# Patient Record
Sex: Male | Born: 1975 | Race: Black or African American | Hispanic: No | Marital: Married | Smoking: Current some day smoker
Health system: Southern US, Community
[De-identification: ages and names within clinical notes are randomized; demographics above are authoritative.]

## PROBLEM LIST (undated history)

## (undated) DIAGNOSIS — G4730 Sleep apnea, unspecified: Secondary | ICD-10-CM

## (undated) DIAGNOSIS — F99 Mental disorder, not otherwise specified: Secondary | ICD-10-CM

## (undated) DIAGNOSIS — E7800 Pure hypercholesterolemia, unspecified: Secondary | ICD-10-CM

## (undated) DIAGNOSIS — G43909 Migraine, unspecified, not intractable, without status migrainosus: Secondary | ICD-10-CM

## (undated) DIAGNOSIS — F4310 Post-traumatic stress disorder, unspecified: Secondary | ICD-10-CM

## (undated) DIAGNOSIS — G8929 Other chronic pain: Secondary | ICD-10-CM

## (undated) DIAGNOSIS — M549 Dorsalgia, unspecified: Secondary | ICD-10-CM

## (undated) HISTORY — PX: TONSILECTOMY, ADENOIDECTOMY, BILATERAL MYRINGOTOMY AND TUBES: SHX2538

## (undated) HISTORY — PX: HEMORRHOID SURGERY: SHX153

## (undated) HISTORY — PX: TONSILLECTOMY: SUR1361

## (undated) HISTORY — PX: SPINAL FUSION: SHX223

---

## 1999-08-03 ENCOUNTER — Encounter: Payer: Self-pay | Admitting: Family Medicine

## 1999-08-03 ENCOUNTER — Ambulatory Visit (HOSPITAL_COMMUNITY): Admission: RE | Admit: 1999-08-03 | Discharge: 1999-08-03 | Payer: Self-pay | Admitting: Family Medicine

## 2001-12-22 ENCOUNTER — Emergency Department (HOSPITAL_COMMUNITY): Admission: EM | Admit: 2001-12-22 | Discharge: 2001-12-22 | Payer: Self-pay | Admitting: Emergency Medicine

## 2007-03-29 ENCOUNTER — Emergency Department (HOSPITAL_COMMUNITY): Admission: EM | Admit: 2007-03-29 | Discharge: 2007-03-29 | Payer: Self-pay | Admitting: Family Medicine

## 2007-09-20 ENCOUNTER — Emergency Department (HOSPITAL_COMMUNITY): Admission: EM | Admit: 2007-09-20 | Discharge: 2007-09-20 | Payer: Self-pay | Admitting: Emergency Medicine

## 2008-03-24 ENCOUNTER — Ambulatory Visit: Payer: Self-pay | Admitting: Family Medicine

## 2008-08-31 ENCOUNTER — Ambulatory Visit: Payer: Self-pay | Admitting: Family Medicine

## 2008-09-11 ENCOUNTER — Ambulatory Visit: Payer: Self-pay | Admitting: Family Medicine

## 2008-09-14 ENCOUNTER — Ambulatory Visit: Payer: Self-pay | Admitting: Family Medicine

## 2011-04-15 ENCOUNTER — Other Ambulatory Visit: Payer: Self-pay | Admitting: "Endocrinology

## 2011-04-15 LAB — CORTISOL: Cortisol, Plasma: 7.4 ug/dL

## 2011-04-22 LAB — 17-HYDROXYPROGESTERONE: 17-OH-Progesterone, LC/MS/MS: 86 ng/dL (ref 42–196)

## 2011-05-26 LAB — POCT RAPID STREP A: Streptococcus, Group A Screen (Direct): NEGATIVE

## 2012-03-21 ENCOUNTER — Ambulatory Visit (HOSPITAL_COMMUNITY)
Admission: RE | Admit: 2012-03-21 | Discharge: 2012-03-21 | Disposition: A | Discharge status: Home / Self Care | Payer: 59 | Attending: Psychiatry | Admitting: Psychiatry

## 2012-03-21 ENCOUNTER — Encounter (HOSPITAL_COMMUNITY): Payer: Self-pay | Admitting: *Deleted

## 2012-03-21 DIAGNOSIS — G43909 Migraine, unspecified, not intractable, without status migrainosus: Secondary | ICD-10-CM | POA: Insufficient documentation

## 2012-03-21 DIAGNOSIS — E7800 Pure hypercholesterolemia, unspecified: Secondary | ICD-10-CM | POA: Insufficient documentation

## 2012-03-21 DIAGNOSIS — F489 Nonpsychotic mental disorder, unspecified: Secondary | ICD-10-CM | POA: Insufficient documentation

## 2012-03-21 DIAGNOSIS — G4700 Insomnia, unspecified: Secondary | ICD-10-CM | POA: Insufficient documentation

## 2012-03-21 DIAGNOSIS — F4310 Post-traumatic stress disorder, unspecified: Secondary | ICD-10-CM | POA: Insufficient documentation

## 2012-03-21 DIAGNOSIS — F17200 Nicotine dependence, unspecified, uncomplicated: Secondary | ICD-10-CM | POA: Insufficient documentation

## 2012-03-21 DIAGNOSIS — Z880 Allergy status to penicillin: Secondary | ICD-10-CM | POA: Insufficient documentation

## 2012-03-21 HISTORY — DX: Pure hypercholesterolemia, unspecified: E78.00

## 2012-03-21 HISTORY — DX: Mental disorder, not otherwise specified: F99
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
## 2012-11-01 ENCOUNTER — Emergency Department (HOSPITAL_COMMUNITY): Payer: 59

## 2012-11-01 ENCOUNTER — Encounter (HOSPITAL_COMMUNITY): Payer: Self-pay | Admitting: Neurology

## 2012-11-01 ENCOUNTER — Emergency Department (HOSPITAL_COMMUNITY)
Admission: EM | Admit: 2012-11-01 | Discharge: 2012-11-01 | Disposition: A | Discharge status: Home / Self Care | Payer: 59 | Attending: Emergency Medicine | Admitting: Emergency Medicine

## 2012-11-01 DIAGNOSIS — G8929 Other chronic pain: Secondary | ICD-10-CM | POA: Insufficient documentation

## 2012-11-01 DIAGNOSIS — G43909 Migraine, unspecified, not intractable, without status migrainosus: Secondary | ICD-10-CM | POA: Insufficient documentation

## 2012-11-01 DIAGNOSIS — E7800 Pure hypercholesterolemia, unspecified: Secondary | ICD-10-CM | POA: Insufficient documentation

## 2012-11-01 DIAGNOSIS — R5381 Other malaise: Secondary | ICD-10-CM | POA: Insufficient documentation

## 2012-11-01 DIAGNOSIS — G4730 Sleep apnea, unspecified: Secondary | ICD-10-CM | POA: Insufficient documentation

## 2012-11-01 DIAGNOSIS — M5416 Radiculopathy, lumbar region: Secondary | ICD-10-CM

## 2012-11-01 DIAGNOSIS — Z8659 Personal history of other mental and behavioral disorders: Secondary | ICD-10-CM | POA: Insufficient documentation

## 2012-11-01 DIAGNOSIS — R5383 Other fatigue: Secondary | ICD-10-CM | POA: Insufficient documentation

## 2012-11-01 DIAGNOSIS — IMO0002 Reserved for concepts with insufficient information to code with codable children: Secondary | ICD-10-CM | POA: Insufficient documentation

## 2012-11-01 DIAGNOSIS — Z79899 Other long term (current) drug therapy: Secondary | ICD-10-CM | POA: Insufficient documentation

## 2012-11-01 DIAGNOSIS — F17200 Nicotine dependence, unspecified, uncomplicated: Secondary | ICD-10-CM | POA: Insufficient documentation

## 2012-11-01 HISTORY — DX: Migraine, unspecified, not intractable, without status migrainosus: G43.909

## 2012-11-01 HISTORY — DX: Other chronic pain: G89.29

## 2012-11-01 HISTORY — DX: Dorsalgia, unspecified: M54.9

## 2012-11-01 HISTORY — DX: Sleep apnea, unspecified: G47.30

## 2012-11-01 MED ORDER — METOCLOPRAMIDE HCL 5 MG/ML IJ SOLN
10.0000 mg | Freq: Once | INTRAMUSCULAR | Status: AC
  Administered 2012-11-01: 10 mg via INTRAVENOUS
  Filled 2012-11-01: qty 2

## 2012-11-01 MED ORDER — KETOROLAC TROMETHAMINE 30 MG/ML IJ SOLN
15.0000 mg | Freq: Once | INTRAMUSCULAR | Status: AC
  Administered 2012-11-01: 15 mg via INTRAVENOUS
  Filled 2012-11-01: qty 1

## 2012-11-01 MED ORDER — METHOCARBAMOL 500 MG PO TABS: 1000.0000 mg | ORAL_TABLET | Freq: Four times a day (QID) | ORAL | Status: DC

## 2012-11-01 MED ORDER — PREDNISONE 20 MG PO TABS: ORAL_TABLET | ORAL | Status: DC

## 2012-11-01 MED ORDER — OXYCODONE-ACETAMINOPHEN 5-325 MG PO TABS: 1.0000 | ORAL_TABLET | Freq: Four times a day (QID) | ORAL | Status: DC | PRN

## 2012-11-01 MED ORDER — IBUPROFEN 600 MG PO TABS: 600.0000 mg | ORAL_TABLET | Freq: Four times a day (QID) | ORAL | Status: DC | PRN

## 2012-11-01 MED ORDER — DIPHENHYDRAMINE HCL 50 MG/ML IJ SOLN
25.0000 mg | Freq: Once | INTRAMUSCULAR | Status: AC
  Administered 2012-11-01: 25 mg via INTRAVENOUS
  Filled 2012-11-01: qty 1
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
## 2012-11-01 NOTE — ED Notes (Signed)
Pt returned from MRI °

## 2012-11-04 NOTE — ED Provider Notes (Signed)
Medical screening examination/treatment/procedure(s) were performed by non-physician practitioner and as supervising physician I was immediately available for consultation/collaboration.   Gwyneth Sprout, MD 11/04/12 2302

## 2012-11-07 ENCOUNTER — Encounter (HOSPITAL_COMMUNITY): Payer: Self-pay | Admitting: Pharmacy Technician

## 2012-11-08 ENCOUNTER — Encounter (HOSPITAL_COMMUNITY): Payer: Self-pay | Admitting: *Deleted

## 2012-11-11 ENCOUNTER — Encounter (HOSPITAL_COMMUNITY): Payer: Self-pay | Admitting: Anesthesiology

## 2012-11-11 ENCOUNTER — Observation Stay (HOSPITAL_COMMUNITY)
Admission: RE | Admit: 2012-11-11 | Discharge: 2012-11-11 | Disposition: A | Discharge status: Home / Self Care | Payer: 59 | Source: Ambulatory Visit | Attending: Neurosurgery | Admitting: Neurosurgery

## 2012-11-11 ENCOUNTER — Observation Stay (HOSPITAL_COMMUNITY): Payer: 59

## 2012-11-11 ENCOUNTER — Ambulatory Visit (HOSPITAL_COMMUNITY): Payer: 59 | Admitting: Anesthesiology

## 2012-11-11 ENCOUNTER — Encounter (HOSPITAL_COMMUNITY)
Admission: RE | Disposition: A | Discharge status: Home / Self Care | Payer: Self-pay | Source: Ambulatory Visit | Attending: Neurosurgery

## 2012-11-11 DIAGNOSIS — M5126 Other intervertebral disc displacement, lumbar region: Principal | ICD-10-CM | POA: Insufficient documentation

## 2012-11-11 DIAGNOSIS — Z23 Encounter for immunization: Secondary | ICD-10-CM | POA: Insufficient documentation

## 2012-11-11 HISTORY — PX: LUMBAR LAMINECTOMY/DECOMPRESSION MICRODISCECTOMY: SHX5026

## 2012-11-11 HISTORY — DX: Post-traumatic stress disorder, unspecified: F43.10

## 2012-11-11 LAB — SURGICAL PCR SCREEN
MRSA, PCR: NEGATIVE
Staphylococcus aureus: NEGATIVE

## 2012-11-11 LAB — CREATININE, SERUM
Creatinine, Ser: 1.09 mg/dL (ref 0.50–1.35)
GFR calc Af Amer: >90 mL/min (ref >90)
GFR calc non Af Amer: 86 mL/min — ABNORMAL LOW (ref >90)

## 2012-11-11 LAB — CBC
HCT: 44.8 % (ref 39.0–52.0)
Hemoglobin: 16.1 g/dL (ref 13.0–17.0)
MCH: 31.9 pg (ref 26.0–34.0)
MCHC: 35.9 g/dL (ref 30.0–36.0)
MCV: 88.7 fL (ref 78.0–100.0)
Platelets: 194 10*3/uL (ref 150–400)
RBC: 5.05 MIL/uL (ref 4.22–5.81)
RDW: 12.5 % (ref 11.5–15.5)
WBC: 5.5 10*3/uL (ref 4.0–10.5)

## 2012-11-11 SURGERY — LUMBAR LAMINECTOMY/DECOMPRESSION MICRODISCECTOMY 1 LEVEL
Anesthesia: General | Site: Back | Laterality: Left | Wound class: Clean

## 2012-11-11 MED ORDER — MUPIROCIN 2 % EX OINT: TOPICAL_OINTMENT | Freq: Two times a day (BID) | CUTANEOUS | Status: DC

## 2012-11-11 MED ORDER — SODIUM CHLORIDE 0.9 % IV SOLN: 250.0000 mL | INTRAVENOUS | Status: DC

## 2012-11-11 MED ORDER — GLYCOPYRROLATE 0.2 MG/ML IJ SOLN
INTRAMUSCULAR | Status: DC | PRN
  Administered 2012-11-11: 0.6 mg via INTRAVENOUS

## 2012-11-11 MED ORDER — OXYCODONE HCL 5 MG PO TABS: 5.0000 mg | ORAL_TABLET | Freq: Once | ORAL | Status: DC | PRN

## 2012-11-11 MED ORDER — FENTANYL CITRATE 0.05 MG/ML IJ SOLN
INTRAMUSCULAR | Status: DC | PRN
  Administered 2012-11-11 (×2): 100 ug via INTRAVENOUS
  Administered 2012-11-11: 50 ug via INTRAVENOUS

## 2012-11-11 MED ORDER — NEOSTIGMINE METHYLSULFATE 1 MG/ML IJ SOLN
INTRAMUSCULAR | Status: DC | PRN
  Administered 2012-11-11: 4 mg via INTRAVENOUS

## 2012-11-11 MED ORDER — ARTIFICIAL TEARS OP OINT
TOPICAL_OINTMENT | OPHTHALMIC | Status: DC | PRN
  Administered 2012-11-11: 1 via OPHTHALMIC

## 2012-11-11 MED ORDER — VANCOMYCIN HCL IN DEXTROSE 1-5 GM/200ML-% IV SOLN: 1000.0000 mg | INTRAVENOUS | Status: DC

## 2012-11-11 MED ORDER — CYCLOBENZAPRINE HCL 10 MG PO TABS
10.0000 mg | ORAL_TABLET | Freq: Three times a day (TID) | ORAL | Status: DC | PRN
  Administered 2012-11-11: 10 mg via ORAL
  Filled 2012-11-11: qty 1

## 2012-11-11 MED ORDER — LACTATED RINGERS IV SOLN
INTRAVENOUS | Status: DC | PRN
  Administered 2012-11-11 (×2): via INTRAVENOUS

## 2012-11-11 MED ORDER — VANCOMYCIN HCL IN DEXTROSE 1-5 GM/200ML-% IV SOLN
1000.0000 mg | Freq: Once | INTRAVENOUS | Status: DC
  Filled 2012-11-11: qty 200

## 2012-11-11 MED ORDER — VECURONIUM BROMIDE 10 MG IV SOLR
INTRAVENOUS | Status: DC | PRN
  Administered 2012-11-11: 2 mg via INTRAVENOUS

## 2012-11-11 MED ORDER — SIMVASTATIN 20 MG PO TABS
20.0000 mg | ORAL_TABLET | Freq: Every evening | ORAL | Status: DC
  Filled 2012-11-11: qty 1

## 2012-11-11 MED ORDER — PROPOFOL 10 MG/ML IV BOLUS
INTRAVENOUS | Status: DC | PRN
  Administered 2012-11-11: 200 mg via INTRAVENOUS

## 2012-11-11 MED ORDER — OXYCODONE HCL 5 MG/5ML PO SOLN: 5.0000 mg | Freq: Once | ORAL | Status: DC | PRN

## 2012-11-11 MED ORDER — OXYCODONE-ACETAMINOPHEN 5-325 MG PO TABS: 1.0000 | ORAL_TABLET | ORAL | Status: DC | PRN

## 2012-11-11 MED ORDER — BACITRACIN 50000 UNITS IM SOLR
INTRAMUSCULAR | Status: AC
  Filled 2012-11-11: qty 1

## 2012-11-11 MED ORDER — DEXAMETHASONE SODIUM PHOSPHATE 10 MG/ML IJ SOLN
INTRAMUSCULAR | Status: AC
  Administered 2012-11-11: 10 mg via INTRAVENOUS
  Filled 2012-11-11: qty 1

## 2012-11-11 MED ORDER — SENNA 8.6 MG PO TABS
1.0000 | ORAL_TABLET | Freq: Two times a day (BID) | ORAL | Status: DC
  Administered 2012-11-11: 8.6 mg via ORAL
  Filled 2012-11-11: qty 1

## 2012-11-11 MED ORDER — KETOROLAC TROMETHAMINE 30 MG/ML IJ SOLN
30.0000 mg | Freq: Four times a day (QID) | INTRAMUSCULAR | Status: DC
  Administered 2012-11-11: 30 mg via INTRAVENOUS
  Filled 2012-11-11: qty 1

## 2012-11-11 MED ORDER — TOPIRAMATE 100 MG PO TABS
200.0000 mg | ORAL_TABLET | Freq: Two times a day (BID) | ORAL | Status: DC
  Administered 2012-11-11: 200 mg via ORAL
  Filled 2012-11-11 (×2): qty 2

## 2012-11-11 MED ORDER — ONDANSETRON HCL 4 MG/2ML IJ SOLN: 4.0000 mg | INTRAMUSCULAR | Status: DC | PRN

## 2012-11-11 MED ORDER — ACETAMINOPHEN 650 MG RE SUPP: 650.0000 mg | RECTAL | Status: DC | PRN

## 2012-11-11 MED ORDER — HYDROMORPHONE HCL PF 1 MG/ML IJ SOLN
INTRAMUSCULAR | Status: AC
  Filled 2012-11-11: qty 1

## 2012-11-11 MED ORDER — MENTHOL 3 MG MT LOZG: 1.0000 | LOZENGE | OROMUCOSAL | Status: DC | PRN

## 2012-11-11 MED ORDER — LIDOCAINE HCL 4 % MT SOLN
OROMUCOSAL | Status: DC | PRN
  Administered 2012-11-11: 4 mL via TOPICAL

## 2012-11-11 MED ORDER — SODIUM CHLORIDE 0.9 % IV SOLN
INTRAVENOUS | Status: AC
  Filled 2012-11-11: qty 500

## 2012-11-11 MED ORDER — 0.9 % SODIUM CHLORIDE (POUR BTL) OPTIME
TOPICAL | Status: DC | PRN
  Administered 2012-11-11: 1000 mL

## 2012-11-11 MED ORDER — VANCOMYCIN HCL IN DEXTROSE 1-5 GM/200ML-% IV SOLN
INTRAVENOUS | Status: AC
  Administered 2012-11-11: 1000 mg via INTRAVENOUS
  Filled 2012-11-11: qty 200

## 2012-11-11 MED ORDER — HYDROCODONE-ACETAMINOPHEN 5-325 MG PO TABS
1.0000 | ORAL_TABLET | ORAL | Status: DC | PRN
  Administered 2012-11-11: 2 via ORAL
  Filled 2012-11-11: qty 2

## 2012-11-11 MED ORDER — CYCLOBENZAPRINE HCL 10 MG PO TABS: 10.0000 mg | ORAL_TABLET | Freq: Three times a day (TID) | ORAL | Status: DC | PRN

## 2012-11-11 MED ORDER — ROCURONIUM BROMIDE 100 MG/10ML IV SOLN
INTRAVENOUS | Status: DC | PRN
  Administered 2012-11-11: 50 mg via INTRAVENOUS

## 2012-11-11 MED ORDER — METOCLOPRAMIDE HCL 5 MG/ML IJ SOLN: 10.0000 mg | Freq: Once | INTRAMUSCULAR | Status: DC | PRN

## 2012-11-11 MED ORDER — HYDROMORPHONE HCL PF 1 MG/ML IJ SOLN
0.2500 mg | INTRAMUSCULAR | Status: DC | PRN
  Administered 2012-11-11 (×2): 0.5 mg via INTRAVENOUS

## 2012-11-11 MED ORDER — MUPIROCIN 2 % EX OINT
TOPICAL_OINTMENT | CUTANEOUS | Status: AC
  Administered 2012-11-11: 07:00:00 via NASAL
  Filled 2012-11-11: qty 22

## 2012-11-11 MED ORDER — KETOROLAC TROMETHAMINE 30 MG/ML IJ SOLN
INTRAMUSCULAR | Status: DC | PRN
  Administered 2012-11-11: 30 mg via INTRAVENOUS

## 2012-11-11 MED ORDER — PHENYLEPHRINE HCL 10 MG/ML IJ SOLN
INTRAMUSCULAR | Status: DC | PRN
  Administered 2012-11-11: 80 ug via INTRAVENOUS

## 2012-11-11 MED ORDER — LIDOCAINE HCL (CARDIAC) 20 MG/ML IV SOLN
INTRAVENOUS | Status: DC | PRN
  Administered 2012-11-11: 100 mg via INTRAVENOUS

## 2012-11-11 MED ORDER — HYDROCODONE-ACETAMINOPHEN 5-325 MG PO TABS: 1.0000 | ORAL_TABLET | ORAL | Status: DC | PRN

## 2012-11-11 MED ORDER — ACETAMINOPHEN 325 MG PO TABS: 650.0000 mg | ORAL_TABLET | ORAL | Status: DC | PRN

## 2012-11-11 MED ORDER — PHENOL 1.4 % MT LIQD: 1.0000 | OROMUCOSAL | Status: DC | PRN

## 2012-11-11 MED ORDER — INFLUENZA VIRUS VACC SPLIT PF IM SUSP
0.5000 mL | Freq: Once | INTRAMUSCULAR | Status: AC
  Administered 2012-11-11: 0.5 mL via INTRAMUSCULAR
  Filled 2012-11-11: qty 0.5

## 2012-11-11 MED ORDER — BUPIVACAINE HCL (PF) 0.25 % IJ SOLN
INTRAMUSCULAR | Status: DC | PRN
  Administered 2012-11-11: 20 mL

## 2012-11-11 MED ORDER — DEXTROSE 5 % IV SOLN
INTRAVENOUS | Status: DC | PRN
  Administered 2012-11-11: 08:00:00 via INTRAVENOUS

## 2012-11-11 MED ORDER — ONDANSETRON HCL 4 MG/2ML IJ SOLN
INTRAMUSCULAR | Status: DC | PRN
  Administered 2012-11-11: 4 mg via INTRAVENOUS

## 2012-11-11 MED ORDER — ALUM & MAG HYDROXIDE-SIMETH 200-200-20 MG/5ML PO SUSP: 30.0000 mL | Freq: Four times a day (QID) | ORAL | Status: DC | PRN

## 2012-11-11 MED ORDER — ZOLPIDEM TARTRATE 5 MG PO TABS: 5.0000 mg | ORAL_TABLET | Freq: Every evening | ORAL | Status: DC | PRN

## 2012-11-11 MED ORDER — SODIUM CHLORIDE 0.9 % IJ SOLN: 3.0000 mL | INTRAMUSCULAR | Status: DC | PRN

## 2012-11-11 MED ORDER — HEMOSTATIC AGENTS (NO CHARGE) OPTIME
TOPICAL | Status: DC | PRN
  Administered 2012-11-11: 1 via TOPICAL

## 2012-11-11 MED ORDER — MIDAZOLAM HCL 5 MG/5ML IJ SOLN
INTRAMUSCULAR | Status: DC | PRN
  Administered 2012-11-11: 2 mg via INTRAVENOUS

## 2012-11-11 MED ORDER — SODIUM CHLORIDE 0.9 % IJ SOLN: 3.0000 mL | Freq: Two times a day (BID) | INTRAMUSCULAR | Status: DC

## 2012-11-11 MED ORDER — HYDROMORPHONE HCL PF 1 MG/ML IJ SOLN
0.5000 mg | INTRAMUSCULAR | Status: DC | PRN
  Administered 2012-11-11: 1 mg via INTRAVENOUS
  Filled 2012-11-11: qty 1

## 2012-11-11 MED ORDER — SODIUM CHLORIDE 0.9 % IR SOLN
Status: DC | PRN
  Administered 2012-11-11: 09:00:00

## 2012-11-11 SURGICAL SUPPLY — 47 items
ADH SKN CLS APL DERMABOND .7 (GAUZE/BANDAGES/DRESSINGS) ×1
APL SKNCLS STERI-STRIP NONHPOA (GAUZE/BANDAGES/DRESSINGS) ×1
BAG DECANTER FOR FLEXI CONT (MISCELLANEOUS) ×2 IMPLANT
BENZOIN TINCTURE PRP APPL 2/3 (GAUZE/BANDAGES/DRESSINGS) ×2 IMPLANT
BLADE SURG ROTATE 9660 (MISCELLANEOUS) IMPLANT
BRUSH SCRUB EZ PLAIN DRY (MISCELLANEOUS) ×2 IMPLANT
BUR CUTTER 7.0 ROUND (BURR) ×2 IMPLANT
CANISTER SUCTION 2500CC (MISCELLANEOUS) ×2 IMPLANT
CLOTH BEACON ORANGE TIMEOUT ST (SAFETY) ×2 IMPLANT
CONT SPEC 4OZ CLIKSEAL STRL BL (MISCELLANEOUS) ×2 IMPLANT
DECANTER SPIKE VIAL GLASS SM (MISCELLANEOUS) ×2 IMPLANT
DERMABOND ADVANCED (GAUZE/BANDAGES/DRESSINGS) ×1
DERMABOND ADVANCED .7 DNX12 (GAUZE/BANDAGES/DRESSINGS) ×1 IMPLANT
DRAPE LAPAROTOMY 100X72X124 (DRAPES) ×2 IMPLANT
DRAPE MICROSCOPE ZEISS OPMI (DRAPES) ×2 IMPLANT
DRAPE POUCH INSTRU U-SHP 10X18 (DRAPES) ×2 IMPLANT
DRAPE PROXIMA HALF (DRAPES) IMPLANT
DRAPE SURG 17X23 STRL (DRAPES) ×4 IMPLANT
ELECT REM PT RETURN 9FT ADLT (ELECTROSURGICAL) ×2
ELECTRODE REM PT RTRN 9FT ADLT (ELECTROSURGICAL) ×1 IMPLANT
GAUZE SPONGE 4X4 16PLY XRAY LF (GAUZE/BANDAGES/DRESSINGS) IMPLANT
GLOVE ECLIPSE 8.5 STRL (GLOVE) ×2 IMPLANT
GLOVE EXAM NITRILE LRG STRL (GLOVE) IMPLANT
GLOVE EXAM NITRILE MD LF STRL (GLOVE) IMPLANT
GLOVE EXAM NITRILE XL STR (GLOVE) IMPLANT
GLOVE EXAM NITRILE XS STR PU (GLOVE) IMPLANT
GOWN BRE IMP SLV AUR LG STRL (GOWN DISPOSABLE) IMPLANT
GOWN BRE IMP SLV AUR XL STRL (GOWN DISPOSABLE) ×2 IMPLANT
GOWN STRL REIN 2XL LVL4 (GOWN DISPOSABLE) IMPLANT
KIT BASIN OR (CUSTOM PROCEDURE TRAY) ×2 IMPLANT
KIT ROOM TURNOVER OR (KITS) ×2 IMPLANT
NDL SPNL 22GX3.5 QUINCKE BK (NEEDLE) ×1 IMPLANT
NEEDLE HYPO 22GX1.5 SAFETY (NEEDLE) ×2 IMPLANT
NEEDLE SPNL 22GX3.5 QUINCKE BK (NEEDLE) ×2 IMPLANT
NS IRRIG 1000ML POUR BTL (IV SOLUTION) ×2 IMPLANT
PACK LAMINECTOMY NEURO (CUSTOM PROCEDURE TRAY) ×2 IMPLANT
PAD ARMBOARD 7.5X6 YLW CONV (MISCELLANEOUS) ×6 IMPLANT
RUBBERBAND STERILE (MISCELLANEOUS) ×4 IMPLANT
SPONGE GAUZE 4X4 12PLY (GAUZE/BANDAGES/DRESSINGS) ×2 IMPLANT
SPONGE SURGIFOAM ABS GEL SZ50 (HEMOSTASIS) ×2 IMPLANT
STRIP CLOSURE SKIN 1/2X4 (GAUZE/BANDAGES/DRESSINGS) ×2 IMPLANT
SUT VIC AB 2-0 CT1 18 (SUTURE) ×2 IMPLANT
SUT VIC AB 3-0 SH 8-18 (SUTURE) ×2 IMPLANT
SYR 20ML ECCENTRIC (SYRINGE) ×2 IMPLANT
TOWEL OR 17X24 6PK STRL BLUE (TOWEL DISPOSABLE) ×2 IMPLANT
TOWEL OR 17X26 10 PK STRL BLUE (TOWEL DISPOSABLE) ×2 IMPLANT
WATER STERILE IRR 1000ML POUR (IV SOLUTION) ×2 IMPLANT

## 2012-11-11 NOTE — H&P (Signed)
Johnny Clarke is an 37 y.o. male.   Chief Complaint: Back and left leg pain HPI: 37 year old male with a long history of back pain with recent worsening overall symptoms and progressive left lower extremity radicular pain failing conservative management. Workup demonstrates evidence of a central L5-S1 disc herniation by his towards the left which is compressing his left S1 nerve root. Remainder of his lumbar spine looks healthy. Patient presents now for left-sided L5-S1 microdiscectomy in hopes of improving his symptoms.  Past Medical History  Diagnosis Date  . Mental disorder   . High cholesterol   . Chronic back pain   . Sleep apnea   . PTSD (post-traumatic stress disorder)   . Migraine     Takes topamax    Past Surgical History  Procedure Laterality Date  . Tonsillectomy    . Hemorrhoid surgery    . Tonsilectomy, adenoidectomy, bilateral myringotomy and tubes      tubes 16 times    History reviewed. No pertinent family history. Social History:  reports that he has been smoking Cigarettes.  He has a .6 pack-year smoking history. He does not have any smokeless tobacco history on file. He reports that he drinks about 3.6 ounces of alcohol per week. He reports that he does not use illicit drugs.  Allergies:  Allergies  Allergen Reactions  . Penicillins     Pt reports that he is allergic to all penicillins.    Medications Prior to Admission  Medication Sig Dispense Refill  . butalbital-aspirin-caffeine (FIORINAL) 50-325-40 MG per capsule Take 1 capsule by mouth every 4 (four) hours as needed for headache.      . simvastatin (ZOCOR) 40 MG tablet Take 20 mg by mouth every evening.      . topiramate (TOPAMAX) 200 MG tablet Take 200 mg by mouth 2 (two) times daily.      Marland Kitchen zolpidem (AMBIEN) 10 MG tablet Take 20 mg by mouth at bedtime.         Results for orders placed during the hospital encounter of 11/11/12 (from the past 48 hour(s))  CBC     Status: None   Collection Time   11/11/12  6:46 AM      Result Value Range   WBC 5.5  4.0 - 10.5 K/uL   RBC 5.05  4.22 - 5.81 MIL/uL   Hemoglobin 16.1  13.0 - 17.0 g/dL   HCT 16.1  09.6 - 04.5 %   MCV 88.7  78.0 - 100.0 fL   MCH 31.9  26.0 - 34.0 pg   MCHC 35.9  30.0 - 36.0 g/dL   RDW 40.9  81.1 - 91.4 %   Platelets 194  150 - 400 K/uL   No results found.  Review of Systems  Constitutional: Negative.   HENT: Negative.   Eyes: Negative.   Respiratory: Negative.   Cardiovascular: Negative.   Gastrointestinal: Negative.   Genitourinary: Negative.   Musculoskeletal: Negative.   Skin: Negative.   Neurological: Negative.   Endo/Heme/Allergies: Negative.   Psychiatric/Behavioral: Negative.     Blood pressure 124/81, pulse 82, temperature 98.6 F (37 C), temperature source Oral, height 5' 9.5" (1.765 m), weight 84.5 kg (186 lb 4.6 oz). Physical Exam  Constitutional: He is oriented to person, place, and time. He appears well-developed and well-nourished.  HENT:  Head: Normocephalic and atraumatic.  Right Ear: External ear normal.  Left Ear: External ear normal.  Nose: Nose normal.  Mouth/Throat: Oropharynx is clear and moist.  Eyes: Conjunctivae and  EOM are normal. Pupils are equal, round, and reactive to light. Right eye exhibits no discharge. Left eye exhibits no discharge.  Neck: Normal range of motion. Neck supple. No tracheal deviation present. No thyromegaly present.  Cardiovascular: Normal rate, regular rhythm, normal heart sounds and intact distal pulses.   No murmur heard. Respiratory: Effort normal and breath sounds normal. No respiratory distress. He has no wheezes.  GI: Soft. Bowel sounds are normal. He exhibits no distension. There is no tenderness.  Musculoskeletal: Normal range of motion. He exhibits no edema and no tenderness.  Neurological: He is alert and oriented to person, place, and time. He has normal reflexes. No cranial nerve deficit. Coordination normal.  Skin: Skin is warm and dry. No  rash noted. No erythema.  Psychiatric: He has a normal mood and affect. His behavior is normal. Judgment and thought content normal.     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 Plan left L5-S1 laminotomy and microdiscectomy. Risks and benefits have been explained. Patient wishes to proceed.  Chanze Teagle A 11/11/2012, 7:40 AM

## 2012-11-11 NOTE — Progress Notes (Signed)
ANTIBIOTIC CONSULT NOTE - INITIAL  Pharmacy Consult for vancomycin Indication: surgical prophylaxis s/p neuro surgery  Allergies  Allergen Reactions  . Penicillins     Pt reports that he is allergic to all penicillins.    Patient Measurements: Height: 5' 9.5" (176.5 cm) Weight: 186 lb 4.6 oz (84.5 kg) IBW/kg (Calculated) : 71.85 Adjusted Body Weight: 84.5 kg   Vital Signs: Temp: 98.9 F (37.2 C) (03/10 1045) Temp src: Oral (03/10 0614) BP: 132/80 mmHg (03/10 1045) Pulse Rate: 71 (03/10 1045) Intake/Output from previous day:   Intake/Output from this shift: Total I/O In: 1250 [I.V.:1250] Out: 10 [Blood:10]  Labs:  Recent Labs  11/11/12 0646  WBC 5.5  HGB 16.1  PLT 194   CrCl is unknown because no creatinine reading has been taken. No results found for this basename: VANCOTROUGH, Leodis Binet, VANCORANDOM, GENTTROUGH, GENTPEAK, GENTRANDOM, TOBRATROUGH, TOBRAPEAK, TOBRARND, AMIKACINPEAK, AMIKACINTROU, AMIKACIN,  in the last 72 hours   Microbiology: Recent Results (from the past 720 hour(s))  SURGICAL PCR SCREEN     Status: None   Collection Time    11/11/12  6:45 AM      Result Value Range Status   MRSA, PCR NEGATIVE  NEGATIVE Final   Staphylococcus aureus NEGATIVE  NEGATIVE Final   Comment:            The Xpert SA Assay (FDA     approved for NASAL specimens     in patients over 46 years of age),     is one component of     a comprehensive surveillance     program.  Test performance has     been validated by [employer] for patients greater     than or equal to 22 year old.     It is not intended     to diagnose infection nor to     guide or monitor treatment.    Medical History: Past Medical History  Diagnosis Date  . Mental disorder   . High cholesterol   . Chronic back pain   . Sleep apnea   . PTSD (post-traumatic stress disorder)   . Migraine     Takes topamax    Medications:  Scheduled:  . bacitracin      . [COMPLETED] dexamethasone       . HYDROmorphone      . influenza  inactive virus vaccine  0.5 mL Intramuscular Once  . ketorolac  30 mg Intravenous Q6H  . mupirocin ointment   Nasal BID  . [COMPLETED] mupirocin ointment      . senna  1 tablet Oral BID  . simvastatin  20 mg Oral QPM  . sodium chloride      . sodium chloride  3 mL Intravenous Q12H  . topiramate  200 mg Oral BID  . [COMPLETED] vancomycin      . [DISCONTINUED] vancomycin  1,000 mg Intravenous 60 min Pre-Op   Infusions:  . sodium chloride     Assessment: 37 yo male s/p neuro surgery will be put on vancomycin x x1 dose for surgical prophylaxis.  Patient doesn't have history of renal dysfunction.  Patient doesn't have a drain per RN and patient received one dose of vancomycin 1g iv x1 at 0756 today.  Goal of Therapy:  Vancomycin trough level 15-20 mcg/ml  Plan:  1) Vancomycin 1g iv x1 at 1930 tonight x1 dose and sign off  Aniela Caniglia, Tsz-Yin 11/11/2012,11:21 AM

## 2012-11-11 NOTE — Brief Op Note (Signed)
11/11/2012  9:01 AM  PATIENT:  Johnny Clarke  37 y.o. male  PRE-OPERATIVE DIAGNOSIS:  HNP  POST-OPERATIVE DIAGNOSIS:  herniated nucleus pulposus  PROCEDURE:  Procedure(s) with comments: LUMBAR LAMINECTOMY/DECOMPRESSION MICRODISCECTOMY 1 LEVEL (Left) - left five sacral one  SURGEON:  Surgeon(s) and Role:    * Temple Pacini, MD - Primary  PHYSICIAN ASSISTANT:   ASSISTANTS: none   ANESTHESIA:   general  EBL:  Total I/O In: 1000 [I.V.:1000] Out: -   BLOOD ADMINISTERED:none  DRAINS: none   LOCAL MEDICATIONS USED:  MARCAINE     SPECIMEN:  No Specimen  DISPOSITION OF SPECIMEN:  N/A  COUNTS:  YES  TOURNIQUET:  * No tourniquets in log *  DICTATION: .Dragon Dictation  PLAN OF CARE: Admit for overnight observation  PATIENT DISPOSITION:  PACU - hemodynamically stable.   Delay start of Pharmacological VTE agent (>24hrs) due to surgical blood loss or risk of bleeding: yes

## 2012-11-11 NOTE — Transfer of Care (Signed)
Immediate Anesthesia Transfer of Care Note  Patient: Johnny Clarke  Procedure(s) Performed: Procedure(s) with comments: LUMBAR LAMINECTOMY/DECOMPRESSION MICRODISCECTOMY 1 LEVEL (Left) - left five sacral one  Patient Location: PACU  Anesthesia Type:General  Level of Consciousness: awake, alert  and oriented  Airway & Oxygen Therapy: Patient Spontanous Breathing and Patient connected to face mask oxygen  Post-op Assessment: Report given to PACU RN, Post -op Vital signs reviewed and stable and Patient moving all extremities X 4  Post vital signs: Reviewed and stable  Complications: No apparent anesthesia complications

## 2012-11-11 NOTE — Preoperative (Signed)
Beta Blockers   Reason not to administer Beta Blockers:Not Applicable 

## 2012-11-11 NOTE — Progress Notes (Signed)
UR COMPLETED  

## 2012-11-11 NOTE — Anesthesia Preprocedure Evaluation (Signed)
Anesthesia Evaluation  Patient identified by MRN, date of birth, ID band Patient awake    Reviewed: Allergy & Precautions, H&P , NPO status , Patient's Chart, lab work & pertinent test results, reviewed documented beta blocker date and time   Airway Mallampati: II TM Distance: >3 FB Neck ROM: full    Dental   Pulmonary sleep apnea , Current Smoker,  breath sounds clear to auscultation        Cardiovascular negative cardio ROS  Rhythm:regular     Neuro/Psych  Headaches, PSYCHIATRIC DISORDERS    GI/Hepatic negative GI ROS, Neg liver ROS,   Endo/Other  negative endocrine ROS  Renal/GU negative Renal ROS  negative genitourinary   Musculoskeletal   Abdominal   Peds  Hematology negative hematology ROS (+)   Anesthesia Other Findings See surgeon's H&P   Reproductive/Obstetrics negative OB ROS                           Anesthesia Physical Anesthesia Plan  ASA: II  Anesthesia Plan: General   Post-op Pain Management:    Induction: Intravenous  Airway Management Planned: Oral ETT  Additional Equipment:   Intra-op Plan:   Post-operative Plan: Extubation in OR  Informed Consent: I have reviewed the patients History and Physical, chart, labs and discussed the procedure including the risks, benefits and alternatives for the proposed anesthesia with the patient or authorized representative who has indicated his/her understanding and acceptance.   Dental Advisory Given  Plan Discussed with: CRNA and Surgeon  Anesthesia Plan Comments:         Anesthesia Quick Evaluation

## 2012-11-11 NOTE — Progress Notes (Signed)
Called Dr. Jordan Likes and asked him to put in the orders on this patient.

## 2012-11-11 NOTE — Anesthesia Postprocedure Evaluation (Signed)
Anesthesia Post Note  Patient: Johnny Clarke  Procedure(s) Performed: Procedure(s) (LRB): LUMBAR LAMINECTOMY/DECOMPRESSION MICRODISCECTOMY 1 LEVEL (Left)  Anesthesia type: General  Patient location: PACU  Post pain: Pain level controlled  Post assessment: Patient's Cardiovascular Status Stable  Last Vitals:  Filed Vitals:   11/11/12 0951  BP:   Pulse: 59  Temp:   Resp: 24    Post vital signs: Reviewed and stable  Level of consciousness: alert  Complications: No apparent anesthesia complications

## 2012-11-11 NOTE — Op Note (Signed)
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  Asst. Surgeon: None  Anesthesia: General  Indication: 37 year old male with back and left lower extremity pain consistent with a left-sided S1 radiculopathy failing conservative management. Workup demonstrates evidence of a large left paracentral disc herniation L5-S1 with compression the thecal sac and bilateral S1 nerve root left greater than right. Patient presents now for laminotomy and microdiscectomy in hopes of improving his symptoms.  Operative note: After induction anesthesia, patient positioned prone onto Wilson frame and appropriately padded. Lumbar region prepped and draped. Incision made overlying L5-S1 interspace. Dissection performed on the left side exposing the lamina and facet joints of L5 and S1. Retractor placed. X-ray taken. Level confirmed. Laminotomy performed using high-speed drill and Kerrison rongeurs to remove the inferior aspect of lamina of L5 medial aspect the L5-S1 facet joint and the superior rim of the S1 lamina. Ligament flavum elevated and resected piecemeal fashion using Kerrison rongeurs. Underlying thecal sac and S1 nerve root identified. Thecal sac and S1 nerve root gently mobilized and retracted towards midline. Disc space identified. Microscope used for microdissection of the left-sided S1 nerve root and underlying disc herniation. Epidural venous plexus was coagulated and cut. Thecal sac and S1 nerve root gently mobilized and retracted towards midline. Disc space and size 15 blade in a rectangular fashion. Wide disc space cleanout was achieved using pituitary rongeurs operative of pituitary rongeurs and Epstein curettes. All elements of the disc herniation were completely  resected. All loose are obvious he jammed his removed and interspace. At this point a very thorough discectomy been achieved. There was no evidence of injury to the thecal sac or nerve roots. Wound is then irrigated with and bike solution. Gelfoam was placed topically for hemostasis which Ascent be good. Microscope and retractor system were removed. Hemostasis of the muscle was achieved with electric R. Wounds and close in layers with Vicryl sutures. Steri-Strips and sterile dressing were applied. There were no apparent complications. Patient tolerated the procedure well and returned to the recovery room postop.

## 2012-11-11 NOTE — Progress Notes (Signed)
Pt doing well. Pt as ambulating in the hallway without difficulty. Pt has voided and pain is controlled. Pt and family given D/C instructions with Rx's, verbal understanding given. Pt D/C'd home via wheelchair per MD order. Rema Fendt, RN

## 2012-11-11 NOTE — Discharge Summary (Signed)
Physician Discharge Summary  Patient ID: [name] MRN: [id number] DOB/AGE: [birth year] 36 y.o.  Admit date: 11/11/2012 Discharge date: 11/11/2012  Admission Diagnoses:  Discharge Diagnoses:  Active Problems:   * No active hospital problems. *   Discharged Condition: good  Hospital Course: Patient admitted to the hospital where he underwent uncomplicated lumbar laminotomy and microdiscectomy at L5-S1 on the left. Postoperatively he is done well. Preoperative back and left leg pain better. Ready for discharge home.  Consults:   Significant Diagnostic Studies:   Treatments:   Discharge Exam: Blood pressure 115/71, pulse 73, temperature 98.6 F (37 C), temperature source Oral, resp. rate 16, height 5' 9.5" (1.765 m), weight 84.5 kg (186 lb 4.6 oz), SpO2 97.00%. Awake and alert. Oriented and appropriate  Nerve function is intact. Motor and sensory function of the extremities normal. Wound clean and dry. Chest and abdomen benign. Disposition: 01-Home or Self Care     Medication List    TAKE these medications       butalbital-aspirin-caffeine 50-325-40 MG per capsule  Commonly known as:  FIORINAL  Take 1 capsule by mouth every 4 (four) hours as needed for headache.     cyclobenzaprine 10 MG tablet  Commonly known as:  FLEXERIL  Take 1 tablet (10 mg total) by mouth 3 (three) times daily as needed for muscle spasms.     HYDROcodone-acetaminophen 5-325 MG per tablet  Commonly known as:  NORCO/VICODIN  Take 1-2 tablets by mouth every 4 (four) hours as needed.     simvastatin 40 MG tablet  Commonly known as:  ZOCOR  Take 20 mg by mouth every evening.     topiramate 200 MG tablet  Commonly known as:  TOPAMAX  Take 200 mg by mouth 2 (two) times daily.     zolpidem 10 MG tablet  Commonly known as:  AMBIEN  Take 20 mg by mouth at bedtime.           Follow-up Information   Follow up with Mccartney Brucks A, MD. Call in 1 week. (Ask for Lurena Joiner)    Contact information:    [address] [place] [address] [phone]       Signed: Julio Sicks A 11/11/2012, 3:05 PM

## 2012-11-13 ENCOUNTER — Encounter (HOSPITAL_COMMUNITY): Payer: Self-pay | Admitting: Neurosurgery

## 2012-11-22 ENCOUNTER — Encounter (HOSPITAL_COMMUNITY): Payer: Self-pay

## 2012-11-22 ENCOUNTER — Emergency Department (HOSPITAL_COMMUNITY): Payer: 59

## 2012-11-22 ENCOUNTER — Emergency Department (HOSPITAL_COMMUNITY)
Admission: EM | Admit: 2012-11-22 | Discharge: 2012-11-22 | Disposition: A | Discharge status: Home / Self Care | Payer: 59 | Attending: Emergency Medicine | Admitting: Emergency Medicine

## 2012-11-22 DIAGNOSIS — F17200 Nicotine dependence, unspecified, uncomplicated: Secondary | ICD-10-CM | POA: Insufficient documentation

## 2012-11-22 DIAGNOSIS — Z79899 Other long term (current) drug therapy: Secondary | ICD-10-CM | POA: Insufficient documentation

## 2012-11-22 DIAGNOSIS — G43909 Migraine, unspecified, not intractable, without status migrainosus: Secondary | ICD-10-CM | POA: Insufficient documentation

## 2012-11-22 DIAGNOSIS — R1084 Generalized abdominal pain: Secondary | ICD-10-CM | POA: Insufficient documentation

## 2012-11-22 DIAGNOSIS — G8929 Other chronic pain: Secondary | ICD-10-CM | POA: Insufficient documentation

## 2012-11-22 DIAGNOSIS — Z8659 Personal history of other mental and behavioral disorders: Secondary | ICD-10-CM | POA: Insufficient documentation

## 2012-11-22 DIAGNOSIS — M549 Dorsalgia, unspecified: Secondary | ICD-10-CM | POA: Insufficient documentation

## 2012-11-22 DIAGNOSIS — G4730 Sleep apnea, unspecified: Secondary | ICD-10-CM | POA: Insufficient documentation

## 2012-11-22 DIAGNOSIS — E7800 Pure hypercholesterolemia, unspecified: Secondary | ICD-10-CM | POA: Insufficient documentation

## 2012-11-22 DIAGNOSIS — R339 Retention of urine, unspecified: Secondary | ICD-10-CM

## 2012-11-22 DIAGNOSIS — K5900 Constipation, unspecified: Secondary | ICD-10-CM | POA: Insufficient documentation

## 2012-11-22 LAB — BASIC METABOLIC PANEL
BUN: 17 mg/dL (ref 6–23)
CO2: 21 mEq/L (ref 19–32)
Calcium: 9.5 mg/dL (ref 8.4–10.5)
Chloride: 106 mEq/L (ref 96–112)
Creatinine, Ser: 1.04 mg/dL (ref 0.50–1.35)
GFR calc Af Amer: >90 mL/min (ref >90)
GFR calc non Af Amer: >90 mL/min (ref >90)
Glucose, Bld: 88 mg/dL (ref 70–99)
Potassium: 3.6 mEq/L (ref 3.5–5.1)
Sodium: 140 mEq/L (ref 135–145)

## 2012-11-22 LAB — URINALYSIS, ROUTINE W REFLEX MICROSCOPIC
Bilirubin Urine: NEGATIVE
Glucose, UA: NEGATIVE mg/dL
Hgb urine dipstick: NEGATIVE
Ketones, ur: NEGATIVE mg/dL
Leukocytes, UA: NEGATIVE
Nitrite: NEGATIVE
Protein, ur: NEGATIVE mg/dL
Specific Gravity, Urine: 1.023 (ref 1.005–1.030)
Urobilinogen, UA: 1 mg/dL (ref 0.0–1.0)
pH: 6.5 (ref 5.0–8.0)

## 2012-11-22 LAB — CBC WITH DIFFERENTIAL/PLATELET
Basophils Absolute: 0 10*3/uL (ref 0.0–0.1)
Basophils Relative: 0 % (ref 0–1)
Eosinophils Absolute: 0 10*3/uL (ref 0.0–0.7)
Eosinophils Relative: 0 % (ref 0–5)
HCT: 45.6 % (ref 39.0–52.0)
Hemoglobin: 16.3 g/dL (ref 13.0–17.0)
Lymphocytes Relative: 18 % (ref 12–46)
Lymphs Abs: 1.5 10*3/uL (ref 0.7–4.0)
MCH: 31.2 pg (ref 26.0–34.0)
MCHC: 35.7 g/dL (ref 30.0–36.0)
MCV: 87.2 fL (ref 78.0–100.0)
Monocytes Absolute: 0.4 10*3/uL (ref 0.1–1.0)
Monocytes Relative: 5 % (ref 3–12)
Neutro Abs: 6.1 10*3/uL (ref 1.7–7.7)
Neutrophils Relative %: 76 % (ref 43–77)
Platelets: 263 10*3/uL (ref 150–400)
RBC: 5.23 MIL/uL (ref 4.22–5.81)
RDW: 12.1 % (ref 11.5–15.5)
WBC: 8 10*3/uL (ref 4.0–10.5)

## 2012-11-22 MED ORDER — LACTULOSE 10 GM/15ML PO SOLN
20.0000 g | Freq: Once | ORAL | Status: AC
  Administered 2012-11-22: 20 g via ORAL
  Filled 2012-11-22: qty 30

## 2012-11-22 MED ORDER — LACTULOSE 10 GM/15ML PO SOLN: 20.0000 g | Freq: Every day | ORAL | Status: AC

## 2012-11-22 MED ORDER — DOCUSATE SODIUM 100 MG PO CAPS: 100.0000 mg | ORAL_CAPSULE | Freq: Two times a day (BID) | ORAL | Status: DC

## 2012-11-22 MED ORDER — SODIUM CHLORIDE 0.9 % IV BOLUS (SEPSIS)
1000.0000 mL | Freq: Once | INTRAVENOUS | Status: AC
  Administered 2012-11-22: 1000 mL via INTRAVENOUS

## 2012-11-22 MED ORDER — GLYCERIN (LAXATIVE) 2.1 G RE SUPP
1.0000 | Freq: Once | RECTAL | Status: AC
  Administered 2012-11-22: 16:00:00 via RECTAL
  Filled 2012-11-22: qty 1

## 2012-11-22 MED ORDER — FLEET ENEMA 7-19 GM/118ML RE ENEM
1.0000 | ENEMA | Freq: Once | RECTAL | Status: AC
  Administered 2012-11-22: 1 via RECTAL
  Filled 2012-11-22: qty 1

## 2012-11-22 NOTE — ED Notes (Addendum)
Pt had small amount of liquid stool pass.

## 2012-11-22 NOTE — ED Notes (Signed)
Pt ambulated to restroom. No BM.

## 2012-11-22 NOTE — ED Notes (Signed)
Pt reports he had sx on the 10th and hasn't had a BM since then. Pt sts yesterday he was unable to urinate and hasn't urinated today. Pt reports he feel bloated. Pt denies n/v/d. Pt reports he has been taking laxatives, stool softeners and had a enema today. After the enema he had some bowel come out but it was a very little bit, since doing the enema the back pain has worsened. Pt reports he also has hemorrhoids. Pt in nad, skin warm and dry, resp e/u.

## 2012-11-22 NOTE — ED Provider Notes (Signed)
History     CSN: [id number]  Arrival date & time 11/22/12  1234   First MD Initiated Contact with Patient 11/22/12 1406      Chief Complaint  Patient presents with  . Constipation  . Urinary Retention    (Consider location/radiation/quality/duration/timing/severity/associated sxs/prior treatment) HPI Patient presents emergency department with constipation, and urinary retention.  Patient is here recent, lumbar discectomy and has not had a bowel movement since that time and Has been taking pain medications.patient denies chest pain, shortness of breath, nausea, vomiting, diarrhea headache, weakness, or fever.  Patient, states, that he did not take anything other than one enema prior to arrival.  Patient denies anything makes his condition worse. Past Medical History  Diagnosis Date  . Mental disorder   . High cholesterol   . Chronic back pain   . Sleep apnea   . PTSD (post-traumatic stress disorder)   . Migraine     Takes topamax    Past Surgical History  Procedure Laterality Date  . Tonsillectomy    . Hemorrhoid surgery    . Tonsilectomy, adenoidectomy, bilateral myringotomy and tubes      tubes 16 times  . Lumbar laminectomy/decompression microdiscectomy Left 11/11/2012    Procedure: LUMBAR LAMINECTOMY/DECOMPRESSION MICRODISCECTOMY 1 LEVEL;  Surgeon: Temple Pacini, MD;  Location: [facility];  Service: Neurosurgery;  Laterality: Left;  left five sacral one    History reviewed. No pertinent family history.  History  Substance Use Topics  . Smoking status: Current Every Day Smoker -- 0.20 packs/day for 3 years    Types: Cigarettes  . Smokeless tobacco: Not on file  . Alcohol Use: 3.6 oz/week    6 Cans of beer per week      Review of Systems All other systems negative except as documented in the HPI. All pertinent positives and negatives as reviewed in the HPI.  Allergies  Penicillins  Home Medications   Current Outpatient Rx  Name  Route  Sig  Dispense   Refill  . butalbital-aspirin-caffeine (FIORINAL) 50-325-40 MG per capsule   Oral   Take 1 capsule by mouth every 4 (four) hours as needed for headache.         . cyclobenzaprine (FLEXERIL) 10 MG tablet   Oral   Take 10 mg by mouth 3 (three) times daily as needed for muscle spasms.         Marland Kitchen HYDROcodone-acetaminophen (NORCO/VICODIN) 5-325 MG per tablet   Oral   Take 1-2 tablets by mouth every 4 (four) hours as needed for pain.         Marland Kitchen nystatin (MYCOSTATIN) 100000 UNIT/ML suspension   Oral   Take 500,000 Units by mouth 4 (four) times daily.         . simvastatin (ZOCOR) 40 MG tablet   Oral   Take 20 mg by mouth every evening.         . topiramate (TOPAMAX) 200 MG tablet   Oral   Take 200 mg by mouth 2 (two) times daily.         Marland Kitchen zolpidem (AMBIEN) 10 MG tablet   Oral   Take 20 mg by mouth at bedtime.            BP 109/73  Pulse 94  Temp(Src) 98.4 F (36.9 C)  Resp 18  SpO2 98%  Physical Exam  Nursing note and vitals reviewed. Constitutional: He is oriented to person, place, and time. He appears well-developed and well-nourished.  HENT:  Head:  Normocephalic and atraumatic.  Mouth/Throat: Uvula is midline. Mucous membranes are dry.  Eyes: Pupils are equal, round, and reactive to light.  Neck: Normal range of motion. Neck supple.  Cardiovascular: Normal rate, regular rhythm and normal heart sounds.  Exam reveals no gallop and no friction rub.   No murmur heard. Pulmonary/Chest: Effort normal and breath sounds normal.  Abdominal: Soft. He exhibits distension. He exhibits no mass. Bowel sounds are decreased. There is generalized tenderness. There is no rebound and no guarding.  Neurological: He is alert and oriented to person, place, and time.  Skin: Skin is warm and dry. No rash noted.    ED Course  Procedures (including critical care time)  Labs Reviewed  BASIC METABOLIC PANEL  CBC WITH DIFFERENTIAL  URINALYSIS, ROUTINE W REFLEX MICROSCOPIC    Dg Abd Acute W/chest  11/22/2012  *RADIOLOGY REPORT*  Clinical Data: Pain, constipation  ACUTE ABDOMEN SERIES (ABDOMEN 2 VIEW & CHEST 1 VIEW)  Comparison: None.  Findings: Cardiomediastinal silhouette is unremarkable.  No acute infiltrate or pleural effusion.  No pulmonary edema.  Moderate colonic stool.  Moderate gas noted in the splenic flexure and hepatic flexure of the colon.  Stool noted within rectum.  Nonspecific nonobstructive small bowel gas pattern.  No free abdominal air.  IMPRESSION: No acute disease.  Nonspecific nonobstructive small bowel gas pattern.  Moderate colonic stool.   Original Report Authenticated By: Natasha Mead, M.D.     I spoke with Dr. Mikal Plane is on for Dr. Dutch Quint, and advised him of the patient's current status and issues.  He felt to have him follow up with urology and at their office.I did a digital rectal exam on the patient, which showed stool in rectal vault, but was soft.patient a glycerin suppository, lactulose, along with fluid.  Patient be given treatment at home.  For this and we will leave the Foley catheter in place.   MDM          Carlyle Dolly, PA-C 11/22/12 [phone]

## 2012-11-22 NOTE — ED Notes (Signed)
Bladder scan showed >716ml of urine. Thayer Ohm, [place] notified. Verbal order for foley catheter.

## 2012-11-22 NOTE — ED Notes (Signed)
Pt asked for a urine sample. Reports he feels like he has to go but is unable to urinate, tried earlier today.

## 2012-11-22 NOTE — ED Notes (Signed)
Pt presents with constipation since back surgery on 3/10.  Pt attempted stool softeners and enema today with no results.  Pt reports no urine output since yesterday.  Pt reports abdomen is distended, reports generalized abdominal pain as well as suprapubic pain.

## 2012-11-22 NOTE — ED Notes (Signed)
Pt back from radiology. Lab at bedside to collect bld.

## 2012-11-25 NOTE — ED Provider Notes (Signed)
Medical screening examination/treatment/procedure(s) were performed by non-physician practitioner and as supervising physician I was immediately available for consultation/collaboration.  Flint Melter, MD 11/25/12 [phone]

## 2013-03-20 ENCOUNTER — Encounter (HOSPITAL_COMMUNITY): Payer: Self-pay | Admitting: Emergency Medicine
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
## 2013-03-21 ENCOUNTER — Emergency Department (HOSPITAL_COMMUNITY): Payer: 59

## 2013-03-21 ENCOUNTER — Emergency Department (HOSPITAL_COMMUNITY)
Admission: EM | Admit: 2013-03-21 | Discharge: 2013-03-21 | Disposition: A | Discharge status: Home / Self Care | Payer: 59 | Attending: Emergency Medicine | Admitting: Emergency Medicine

## 2013-03-21 DIAGNOSIS — E7800 Pure hypercholesterolemia, unspecified: Secondary | ICD-10-CM | POA: Insufficient documentation

## 2013-03-21 DIAGNOSIS — S20219A Contusion of unspecified front wall of thorax, initial encounter: Secondary | ICD-10-CM | POA: Insufficient documentation

## 2013-03-21 DIAGNOSIS — F4310 Post-traumatic stress disorder, unspecified: Secondary | ICD-10-CM | POA: Insufficient documentation

## 2013-03-21 DIAGNOSIS — F29 Unspecified psychosis not due to a substance or known physiological condition: Secondary | ICD-10-CM | POA: Insufficient documentation

## 2013-03-21 DIAGNOSIS — S20212A Contusion of left front wall of thorax, initial encounter: Secondary | ICD-10-CM

## 2013-03-21 DIAGNOSIS — F17200 Nicotine dependence, unspecified, uncomplicated: Secondary | ICD-10-CM | POA: Insufficient documentation

## 2013-03-21 DIAGNOSIS — Z880 Allergy status to penicillin: Secondary | ICD-10-CM | POA: Insufficient documentation

## 2013-03-21 DIAGNOSIS — G43909 Migraine, unspecified, not intractable, without status migrainosus: Secondary | ICD-10-CM | POA: Insufficient documentation

## 2013-03-21 DIAGNOSIS — Z79899 Other long term (current) drug therapy: Secondary | ICD-10-CM | POA: Insufficient documentation

## 2013-03-21 DIAGNOSIS — S40012A Contusion of left shoulder, initial encounter: Secondary | ICD-10-CM

## 2013-03-21 DIAGNOSIS — W07XXXA Fall from chair, initial encounter: Secondary | ICD-10-CM | POA: Insufficient documentation

## 2013-03-21 DIAGNOSIS — S40019A Contusion of unspecified shoulder, initial encounter: Secondary | ICD-10-CM | POA: Insufficient documentation

## 2013-03-21 DIAGNOSIS — G8929 Other chronic pain: Secondary | ICD-10-CM | POA: Insufficient documentation

## 2013-03-21 DIAGNOSIS — Y9389 Activity, other specified: Secondary | ICD-10-CM | POA: Insufficient documentation

## 2013-03-21 DIAGNOSIS — G4730 Sleep apnea, unspecified: Secondary | ICD-10-CM | POA: Insufficient documentation

## 2013-03-21 DIAGNOSIS — Y929 Unspecified place or not applicable: Secondary | ICD-10-CM | POA: Insufficient documentation

## 2013-03-21 LAB — BASIC METABOLIC PANEL
BUN: 14 mg/dL (ref 6–23)
CO2: 21 mEq/L (ref 19–32)
Calcium: 8.9 mg/dL (ref 8.4–10.5)
Chloride: 109 mEq/L (ref 96–112)
Creatinine, Ser: 1.08 mg/dL (ref 0.50–1.35)
GFR calc Af Amer: >90 mL/min (ref >90)
GFR calc non Af Amer: 87 mL/min — ABNORMAL LOW (ref >90)
Glucose, Bld: 98 mg/dL (ref 70–99)
Potassium: 3.5 mEq/L (ref 3.5–5.1)
Sodium: 139 mEq/L (ref 135–145)

## 2013-03-21 LAB — CBC
HCT: 42.2 % (ref 39.0–52.0)
Hemoglobin: 15.4 g/dL (ref 13.0–17.0)
MCH: 32.2 pg (ref 26.0–34.0)
MCHC: 36.5 g/dL — ABNORMAL HIGH (ref 30.0–36.0)
MCV: 88.1 fL (ref 78.0–100.0)
Platelets: 208 10*3/uL (ref 150–400)
RBC: 4.79 MIL/uL (ref 4.22–5.81)
RDW: 12.6 % (ref 11.5–15.5)
WBC: 4.9 10*3/uL (ref 4.0–10.5)

## 2013-03-21 LAB — POCT I-STAT TROPONIN I: Troponin i, poc: 0.03 ng/mL (ref 0.00–0.08)

## 2013-03-21 MED ORDER — SODIUM CHLORIDE 0.9 % IV BOLUS (SEPSIS)
1000.0000 mL | Freq: Once | INTRAVENOUS | Status: AC
  Administered 2013-03-21: 1000 mL via INTRAVENOUS

## 2013-03-21 MED ORDER — TRAMADOL HCL 50 MG PO TABS: 50.0000 mg | ORAL_TABLET | Freq: Four times a day (QID) | ORAL | Status: DC | PRN

## 2013-03-21 NOTE — ED Notes (Signed)
Patient presents with c/o pain to the left chest, which is reproducable with pressure.  Had back surgery in March (10th).  Since then has had weakness on the left side.  Dressing to the lower back which is on due to a possible bug bite.

## 2013-03-21 NOTE — ED Provider Notes (Signed)
History    CSN: [id number] Arrival date & time 03/21/13  0001  First MD Initiated Contact with Patient 03/21/13 0006     Chief Complaint  Patient presents with  . Chest Pain  . Fall   (Consider location/radiation/quality/duration/timing/severity/associated sxs/prior Treatment) HPI This patient is a 37 yo man who is several months out from L5/S1 laminectomy and microdiscectomy. The patient's pre-op complaint was numbness in the left leg.   The patient presents today by EMS. He was sitting on his couch and rose to stand. He could not feel his left leg and this caused him to fall. He landed left side down on the floor. He denies headache, loss of consciousness. He has some aching pain in his left chest which is worse with movement of the left shoulder. He also has diffuse pain left shoulder. His pain is 5 on a 0-to-10 scale. It is aching in nature it is nonradiating.  The patient denies experiencing any left lower extremity weakness. He has lingering low back pain which is worse on the left than the right. His last postop visit was in May. He denies any other neurologic complaints or symptoms.  He also denies shortness of breath. Past Medical History  Diagnosis Date  . Mental disorder   . High cholesterol   . Chronic back pain   . Sleep apnea   . PTSD (post-traumatic stress disorder)   . Migraine     Takes topamax   Past Surgical History  Procedure Laterality Date  . Tonsillectomy    . Hemorrhoid surgery    . Tonsilectomy, adenoidectomy, bilateral myringotomy and tubes      tubes 16 times  . Lumbar laminectomy/decompression microdiscectomy Left 11/11/2012    Procedure: LUMBAR LAMINECTOMY/DECOMPRESSION MICRODISCECTOMY 1 LEVEL;  Surgeon: Temple Pacini, MD;  Location: [facility];  Service: Neurosurgery;  Laterality: Left;  left five sacral one   No family history on file. History  Substance Use Topics  . Smoking status: Current Every Day Smoker -- 0.20 packs/day for 3 years   Types: Cigarettes  . Smokeless tobacco: Not on file  . Alcohol Use: 3.6 oz/week    6 Cans of beer per week    Review of Systems Gen: no weight loss, fevers, chills, night sweats Eyes: no discharge or drainage, no occular pain or visual changes Nose: no epistaxis or rhinorrhea Mouth: no dental pain, no sore throat Neck: no neck pain Lungs: no SOB, cough, wheezing CV: chest wall pain as per hpi, palpitations, dependent edema or orthopnea Abd: no abdominal pain, nausea, vomiting GU: no dysuria or gross hematuria MSK: as per hpi, otherwise neg Neuro: as per hpi, otherwise neg Skin: no rash Psyche: negative. Allergies  Penicillins  Home Medications   Current Outpatient Rx  Name  Route  Sig  Dispense  Refill  . butalbital-aspirin-caffeine (FIORINAL) 50-325-40 MG per capsule   Oral   Take 1 capsule by mouth every 4 (four) hours as needed for headache.         . simvastatin (ZOCOR) 40 MG tablet   Oral   Take 20 mg by mouth every evening.         . topiramate (TOPAMAX) 200 MG tablet   Oral   Take 200 mg by mouth 2 (two) times daily.         Marland Kitchen zolpidem (AMBIEN) 10 MG tablet   Oral   Take 20 mg by mouth at bedtime.  BP 129/89  Pulse 67  Temp(Src) 97.5 F (36.4 C)  Resp 21  SpO2 97% Physical Exam Gen: well developed and well nourished appearing, no acute distress, sleeping but easily arousable Head: NCAT Eyes: PERL, EOMI Nose: no epistaixis or rhinorrhea Mouth/throat: mucosa is moist and pink Neck: supple, no stridor, no c spine ttp Chest wall-there is tenderness to palpation over left anterior superior chest wall. The patient has pain in this area with range of motion of the left shoulder, particularly with internal and external rotation. There are no abrasions, lacerations, bruising or crepitus. Heart-regular S1-S2, no murmur, extremities well Lungs: CTA B, no wheezing, rhonchi or rales Abd: soft, notender, nondistended Back: Well-healed Midline  incision in the lower lumbar region, no midline ttp Skin: no rash, wnl Neuro: CN ii-xii grossly intact, no focal deficits, motor strength 5/5 both LE, DTRs brisk and symmetric, sensation intact to light touch throughout both LE Psyche; flat affect,  calm and cooperative.   ED Course  Procedures (including critical care time)  Results for orders placed during the hospital encounter of 03/21/13 (from the past 24 hour(s))  CBC     Status: Abnormal   Collection Time    03/21/13 12:13 AM      Result Value Range   WBC 4.9  4.0 - 10.5 K/uL   RBC 4.79  4.22 - 5.81 MIL/uL   Hemoglobin 15.4  13.0 - 17.0 g/dL   HCT 40.9  81.1 - 91.4 %   MCV 88.1  78.0 - 100.0 fL   MCH 32.2  26.0 - 34.0 pg   MCHC 36.5 (*) 30.0 - 36.0 g/dL   RDW 78.2  95.6 - 21.3 %   Platelets 208  150 - 400 K/uL  BASIC METABOLIC PANEL     Status: Abnormal   Collection Time    03/21/13 12:13 AM      Result Value Range   Sodium 139  135 - 145 mEq/L   Potassium 3.5  3.5 - 5.1 mEq/L   Chloride 109  96 - 112 mEq/L   CO2 21  19 - 32 mEq/L   Glucose, Bld 98  70 - 99 mg/dL   BUN 14  6 - 23 mg/dL   Creatinine, Ser 0.86  0.50 - 1.35 mg/dL   Calcium 8.9  8.4 - 57.8 mg/dL   GFR calc non Af Amer 87 (*) >90 mL/min   GFR calc Af Amer >90  >90 mL/min  POCT I-STAT TROPONIN I     Status: None   Collection Time    03/21/13 12:18 AM      Result Value Range   Troponin i, poc 0.03  0.00 - 0.08 ng/mL   Comment 3             Dg Chest Port 1 View  03/21/2013   *RADIOLOGY REPORT*  Clinical Data: Left chest pain after fall.  PORTABLE CHEST - 1 VIEW  Comparison: 11/22/2012  Findings: Low lung volumes without focal airspace disease or pneumothorax.  Heart and mediastinum are within normal limits. Bony thorax is intact.  IMPRESSION: Low lung volumes without focal disease.   Original Report Authenticated By: Richarda Overlie, M.D.    MDM  Patient with left chest wall & shoulder pain secondary to contusion. He has no LLE weakness or paresthesias at  this time. However, in light of his recent PSH, I have recommended that he follow up with his spine surgeon in the next week for a recheck.   Raynelle Fanning  Lavella Lemons, MD 03/21/13 [phone]

## 2013-03-21 NOTE — ED Notes (Addendum)
Patient discharged, instructions given to SO   Instructed them to look for a primary MD

## 2013-04-05 ENCOUNTER — Emergency Department (HOSPITAL_COMMUNITY)
Admission: EM | Admit: 2013-04-05 | Discharge: 2013-04-06 | Disposition: A | Discharge status: Home / Self Care | Payer: 59 | Attending: Emergency Medicine | Admitting: Emergency Medicine

## 2013-04-05 ENCOUNTER — Encounter (HOSPITAL_COMMUNITY): Payer: Self-pay | Admitting: Emergency Medicine

## 2013-04-05 ENCOUNTER — Ambulatory Visit (HOSPITAL_COMMUNITY)
Admission: AD | Admit: 2013-04-05 | Discharge: 2013-04-05 | Disposition: A | Discharge status: Home / Self Care | Payer: 59 | Attending: Psychiatry | Admitting: Psychiatry

## 2013-04-05 DIAGNOSIS — Z862 Personal history of diseases of the blood and blood-forming organs and certain disorders involving the immune mechanism: Secondary | ICD-10-CM | POA: Insufficient documentation

## 2013-04-05 DIAGNOSIS — F17200 Nicotine dependence, unspecified, uncomplicated: Secondary | ICD-10-CM | POA: Insufficient documentation

## 2013-04-05 DIAGNOSIS — Z8669 Personal history of other diseases of the nervous system and sense organs: Secondary | ICD-10-CM | POA: Insufficient documentation

## 2013-04-05 DIAGNOSIS — R269 Unspecified abnormalities of gait and mobility: Secondary | ICD-10-CM | POA: Insufficient documentation

## 2013-04-05 DIAGNOSIS — Z8639 Personal history of other endocrine, nutritional and metabolic disease: Secondary | ICD-10-CM | POA: Insufficient documentation

## 2013-04-05 DIAGNOSIS — R45851 Suicidal ideations: Secondary | ICD-10-CM | POA: Insufficient documentation

## 2013-04-05 DIAGNOSIS — G8929 Other chronic pain: Secondary | ICD-10-CM | POA: Insufficient documentation

## 2013-04-05 DIAGNOSIS — F3289 Other specified depressive episodes: Secondary | ICD-10-CM | POA: Insufficient documentation

## 2013-04-05 DIAGNOSIS — F32A Depression, unspecified: Secondary | ICD-10-CM

## 2013-04-05 DIAGNOSIS — M549 Dorsalgia, unspecified: Secondary | ICD-10-CM | POA: Insufficient documentation

## 2013-04-05 DIAGNOSIS — F411 Generalized anxiety disorder: Secondary | ICD-10-CM | POA: Insufficient documentation

## 2013-04-05 DIAGNOSIS — F329 Major depressive disorder, single episode, unspecified: Secondary | ICD-10-CM | POA: Insufficient documentation

## 2013-04-05 DIAGNOSIS — Z8659 Personal history of other mental and behavioral disorders: Secondary | ICD-10-CM | POA: Insufficient documentation

## 2013-04-05 DIAGNOSIS — Z880 Allergy status to penicillin: Secondary | ICD-10-CM | POA: Insufficient documentation

## 2013-04-05 DIAGNOSIS — G479 Sleep disorder, unspecified: Secondary | ICD-10-CM | POA: Insufficient documentation

## 2013-04-05 LAB — COMPREHENSIVE METABOLIC PANEL
ALT: 25 U/L (ref 0–53)
AST: 33 U/L (ref 0–37)
Albumin: 4.2 g/dL (ref 3.5–5.2)
Alkaline Phosphatase: 72 U/L (ref 39–117)
BUN: 12 mg/dL (ref 6–23)
CO2: 23 mEq/L (ref 19–32)
Calcium: 9.3 mg/dL (ref 8.4–10.5)
Chloride: 104 mEq/L (ref 96–112)
Creatinine, Ser: 1.15 mg/dL (ref 0.50–1.35)
GFR calc Af Amer: >90 mL/min (ref >90)
GFR calc non Af Amer: 80 mL/min — ABNORMAL LOW (ref >90)
Glucose, Bld: 87 mg/dL (ref 70–99)
Potassium: 3.2 mEq/L — ABNORMAL LOW (ref 3.5–5.1)
Sodium: 139 mEq/L (ref 135–145)
Total Bilirubin: 0.6 mg/dL (ref 0.3–1.2)
Total Protein: 7.6 g/dL (ref 6.0–8.3)

## 2013-04-05 LAB — CBC
HCT: 48.9 % (ref 39.0–52.0)
Hemoglobin: 17.7 g/dL — ABNORMAL HIGH (ref 13.0–17.0)
MCH: 32.1 pg (ref 26.0–34.0)
MCHC: 36.2 g/dL — ABNORMAL HIGH (ref 30.0–36.0)
MCV: 88.6 fL (ref 78.0–100.0)
Platelets: 191 10*3/uL (ref 150–400)
RBC: 5.52 MIL/uL (ref 4.22–5.81)
RDW: 12.5 % (ref 11.5–15.5)
WBC: 5.2 10*3/uL (ref 4.0–10.5)

## 2013-04-05 LAB — URINALYSIS, ROUTINE W REFLEX MICROSCOPIC
Bilirubin Urine: NEGATIVE
Glucose, UA: NEGATIVE mg/dL
Hgb urine dipstick: NEGATIVE
Ketones, ur: NEGATIVE mg/dL
Leukocytes, UA: NEGATIVE
Nitrite: NEGATIVE
Protein, ur: NEGATIVE mg/dL
Specific Gravity, Urine: 1.023 (ref 1.005–1.030)
Urobilinogen, UA: 1 mg/dL (ref 0.0–1.0)
pH: 6 (ref 5.0–8.0)

## 2013-04-05 LAB — RAPID URINE DRUG SCREEN, HOSP PERFORMED
Amphetamines: NOT DETECTED
Barbiturates: POSITIVE — AB
Benzodiazepines: NOT DETECTED
Cocaine: NOT DETECTED
Opiates: NOT DETECTED
Tetrahydrocannabinol: NOT DETECTED

## 2013-04-05 LAB — SALICYLATE LEVEL: Salicylate Lvl: <2 mg/dL — ABNORMAL LOW (ref 2.8–20.0)

## 2013-04-05 LAB — ACETAMINOPHEN LEVEL: Acetaminophen (Tylenol), Serum: <15 ug/mL (ref 10–30)

## 2013-04-05 LAB — ETHANOL: Alcohol, Ethyl (B): <11 mg/dL (ref 0–11)

## 2013-04-05 MED ORDER — IBUPROFEN 200 MG PO TABS: 600.0000 mg | ORAL_TABLET | Freq: Three times a day (TID) | ORAL | Status: DC | PRN

## 2013-04-05 MED ORDER — ONDANSETRON HCL 4 MG PO TABS: 4.0000 mg | ORAL_TABLET | Freq: Three times a day (TID) | ORAL | Status: DC | PRN

## 2013-04-05 MED ORDER — NICOTINE 21 MG/24HR TD PT24
21.0000 mg | MEDICATED_PATCH | Freq: Every day | TRANSDERMAL | Status: DC
  Filled 2013-04-05: qty 1

## 2013-04-05 MED ORDER — PROMETHAZINE HCL 25 MG PO TABS
12.5000 mg | ORAL_TABLET | ORAL | Status: DC | PRN
  Filled 2013-04-05: qty 1

## 2013-04-05 MED ORDER — POTASSIUM CHLORIDE CRYS ER 20 MEQ PO TBCR: 40.0000 meq | EXTENDED_RELEASE_TABLET | Freq: Once | ORAL | Status: DC

## 2013-04-05 MED ORDER — LORAZEPAM 1 MG PO TABS: 1.0000 mg | ORAL_TABLET | Freq: Three times a day (TID) | ORAL | Status: DC | PRN

## 2013-04-05 MED ORDER — ACETAMINOPHEN 325 MG PO TABS: 650.0000 mg | ORAL_TABLET | ORAL | Status: DC | PRN

## 2013-04-05 MED ORDER — BUTALBITAL-APAP-CAFFEINE 50-325-40 MG PO TABS
2.0000 | ORAL_TABLET | Freq: Once | ORAL | Status: AC
  Administered 2013-04-05: 2 via ORAL
  Filled 2013-04-05: qty 2

## 2013-04-05 MED ORDER — ZOLPIDEM TARTRATE 5 MG PO TABS: 5.0000 mg | ORAL_TABLET | Freq: Every evening | ORAL | Status: DC | PRN

## 2013-04-05 MED ORDER — ONDANSETRON 4 MG PO TBDP
8.0000 mg | ORAL_TABLET | Freq: Once | ORAL | Status: AC
  Administered 2013-04-05: 8 mg via ORAL
  Filled 2013-04-05: qty 2

## 2013-04-05 NOTE — ED Notes (Signed)
Pt advised about low potassium.  Pt refuses to take his potassium pills.

## 2013-04-05 NOTE — ED Notes (Signed)
Spoke with Tom at [facility] who will speak to the MD about this patient about whether or not he is accepted.  He will call back when he finds out.
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
## 2013-04-05 NOTE — ED Provider Notes (Signed)
CSN: [id number]     Arrival date & time 04/05/13  1401 History     First MD Initiated Contact with Patient 04/05/13 1503     Chief Complaint  Patient presents with  . Medical Clearance   (Consider location/radiation/quality/duration/timing/severity/associated sxs/prior Treatment) HPI Johnny Clarke is a 37 y.o. male who presents to ED with complaint of depression, suicidal thoughts, chronic pain. Pt states he has chronic back pain, had lumbar laminectomy/decopresison microdiskectomy 5 months ago. States back pain not improving since then. Denies any new symptoms, however, states medications he was on were making him constipated, and he had trouble urinating while on them, so he took himself off of all of them 5 wks ago. States since then, worsening depression, worsening pain, unable to sleep, suicidal thoughts. [facility] and [facility] who sent him here for further evaluation. Pt denies any new back pain, no new numbness or weakness of extremities. States no current issues controlling his bowels or urine. Denies HI.    Past Medical History  Diagnosis Date  . Mental disorder   . High cholesterol   . Chronic back pain   . Sleep apnea   . PTSD (post-traumatic stress disorder)   . Migraine     Takes topamax   Past Surgical History  Procedure Laterality Date  . Tonsillectomy    . Hemorrhoid surgery    . Tonsilectomy, adenoidectomy, bilateral myringotomy and tubes      tubes 16 times  . Lumbar laminectomy/decompression microdiscectomy Left 11/11/2012    Procedure: LUMBAR LAMINECTOMY/DECOMPRESSION MICRODISCECTOMY 1 LEVEL;  Surgeon: Temple Pacini, MD;  Location: [facility];  Service: Neurosurgery;  Laterality: Left;  left five sacral one   History reviewed. No pertinent family history. History  Substance Use Topics  . Smoking status: Current Every Day Smoker -- 0.20 packs/day for 3 years    Types: Cigarettes  . Smokeless tobacco: Not on file  . Alcohol  Use: 3.6 oz/week    6 Cans of beer per week    Review of Systems  Constitutional: Negative for fever and chills.  Respiratory: Negative.   Cardiovascular: Negative.   Genitourinary: Negative for dysuria and flank pain.  Musculoskeletal: Positive for back pain and gait problem.  Psychiatric/Behavioral: Positive for sleep disturbance. The patient is nervous/anxious.        Positive for depression and suicidal ideas. Negative for HI.   All other systems reviewed and are negative.    Allergies  Penicillins  Home Medications  No current outpatient prescriptions on file. BP 112/69  Pulse 54  Temp(Src) 99 F (37.2 C) (Oral)  Resp 20  SpO2 99% Physical Exam  Nursing note and vitals reviewed. Constitutional: He is oriented to person, place, and time. He appears well-developed and well-nourished. No distress.  HENT:  Head: Normocephalic.  Eyes: Conjunctivae are normal.  Neck: Neck supple.  Cardiovascular: Normal rate, regular rhythm and normal heart sounds.   Pulmonary/Chest: Effort normal and breath sounds normal. No respiratory distress. He has no wheezes. He has no rales.  Abdominal: Soft. Bowel sounds are normal. He exhibits no distension. There is no tenderness. There is no rebound.  Musculoskeletal: He exhibits no edema.  Lumbar spine tenderness. Pain with bilateral straight leg raise.   Neurological: He is alert and oriented to person, place, and time.  Skin: Skin is warm and dry.  Papular erythematous rash over legs and arms.   Psychiatric: His affect is blunt. His speech is delayed. He  is slowed. He exhibits a depressed mood. He expresses suicidal ideation.    ED Course   Procedures (including critical care time)  3:32 PM Pt seen and evaluated. Chronic back pain and depression/PTSD. Having suicidal ideations, which I think may be related to his chronic pain as well as being off of his psychiatric medications. At this time, no signs of cauda equina. WIll get med  clearance labs, monitor. Telepsych ordered. No ACT onsite at this moment.   Results for orders placed during the hospital encounter of 04/05/13  ACETAMINOPHEN LEVEL      Result Value Range   Acetaminophen (Tylenol), Serum <15.0  10 - 30 ug/mL  CBC      Result Value Range   WBC 5.2  4.0 - 10.5 K/uL   RBC 5.52  4.22 - 5.81 MIL/uL   Hemoglobin 17.7 (*) 13.0 - 17.0 g/dL   HCT 16.1  09.6 - 04.5 %   MCV 88.6  78.0 - 100.0 fL   MCH 32.1  26.0 - 34.0 pg   MCHC 36.2 (*) 30.0 - 36.0 g/dL   RDW 40.9  81.1 - 91.4 %   Platelets 191  150 - 400 K/uL  COMPREHENSIVE METABOLIC PANEL      Result Value Range   Sodium 139  135 - 145 mEq/L   Potassium 3.2 (*) 3.5 - 5.1 mEq/L   Chloride 104  96 - 112 mEq/L   CO2 23  19 - 32 mEq/L   Glucose, Bld 87  70 - 99 mg/dL   BUN 12  6 - 23 mg/dL   Creatinine, Ser 7.82  0.50 - 1.35 mg/dL   Calcium 9.3  8.4 - 95.6 mg/dL   Total Protein 7.6  6.0 - 8.3 g/dL   Albumin 4.2  3.5 - 5.2 g/dL   AST 33  0 - 37 U/L   ALT 25  0 - 53 U/L   Alkaline Phosphatase 72  39 - 117 U/L   Total Bilirubin 0.6  0.3 - 1.2 mg/dL   GFR calc non Af Amer 80 (*) >90 mL/min   GFR calc Af Amer >90  >90 mL/min  ETHANOL      Result Value Range   Alcohol, Ethyl (B) <11  0 - 11 mg/dL  SALICYLATE LEVEL      Result Value Range   Salicylate Lvl <2.0 (*) 2.8 - 20.0 mg/dL  URINE RAPID DRUG SCREEN (HOSP PERFORMED)      Result Value Range   Opiates NONE DETECTED  NONE DETECTED   Cocaine NONE DETECTED  NONE DETECTED   Benzodiazepines NONE DETECTED  NONE DETECTED   Amphetamines NONE DETECTED  NONE DETECTED   Tetrahydrocannabinol NONE DETECTED  NONE DETECTED   Barbiturates POSITIVE (*) NONE DETECTED  URINALYSIS, ROUTINE W REFLEX MICROSCOPIC      Result Value Range   Color, Urine YELLOW  YELLOW   APPearance CLEAR  CLEAR   Specific Gravity, Urine 1.023  1.005 - 1.030   pH 6.0  5.0 - 8.0   Glucose, UA NEGATIVE  NEGATIVE mg/dL   Hgb urine dipstick NEGATIVE  NEGATIVE   Bilirubin Urine NEGATIVE   NEGATIVE   Ketones, ur NEGATIVE  NEGATIVE mg/dL   Protein, ur NEGATIVE  NEGATIVE mg/dL   Urobilinogen, UA 1.0  0.0 - 1.0 mg/dL   Nitrite NEGATIVE  NEGATIVE   Leukocytes, UA NEGATIVE  NEGATIVE     No results found.  1. Depression   2. Chronic back pain   3.  Suicidal ideation     MDM  Pt with hx of depression, PTSD, chronic pain, here with increased depression and SI, which i suspect is due to his chronic back pain and being off of his medications. Pt's symptoms are chronic. No medical treatment or further evaluation are necessary in ED at this time. Pt does have problems ambulating due to pain, however he does not want to take any pain medications because they constipate him. [facility] did not want to take him due to this issue. Will move to psych ED, pending disposition. Telepsych ordered, also discussed with ACT.   Filed Vitals:   04/05/13 1440 04/05/13 1858  BP: 112/69 130/80  Pulse: 54 60  Temp: 99 F (37.2 C) 98.6 F (37 C)  TempSrc: Oral Oral  Resp: 20 20  SpO2: 99% 100%     Myriam Jacobson Gemini Beaumier, PA-C 04/06/13 0038

## 2013-04-05 NOTE — ED Notes (Signed)
Pt aaox3.  Pt c/o migraine sine last night.  md made aware, new orders given.  Pt denies SI/HI/AH at this time.  Pt reports AH at night.  Pt reports "hearing voices"  Pt denies that the voices are commanding.  Pt reports VH.  Pt reports "seeing his dad walking around earlier today"  Pt reports that his father has been deceased since age 37.  Pt calm and cooperative.

## 2013-04-05 NOTE — ED Notes (Signed)
Pt asking to leave.  Pt asked if he would be willing to speak with dr before leaving.  Pt is okay with speaking with doctor.  md pollina notified.  telepsych faxed and called.
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
## 2013-04-05 NOTE — ED Notes (Addendum)
Pt began vomiting shortly after arrival to unit and reports that he has a migraine HA-typical HA normally takes furinol and phenergan to releave it.  Pt rates pain 6/10

## 2013-04-06 DIAGNOSIS — G4700 Insomnia, unspecified: Secondary | ICD-10-CM

## 2013-04-06 DIAGNOSIS — F0630 Mood disorder due to known physiological condition, unspecified: Secondary | ICD-10-CM

## 2013-04-06 NOTE — BH Assessment (Signed)
[facility] Assessment Progress Note   Spoke to spouse and she reports no danger or fears of pt returning home.  Spouse reported frustration because "I thought taking him to that mental health place across the street was a place he could stay for awhile."  ACT informed her of the process and how inptx mental health is only for a week or so, sometimes less.  "Well that isn't what I need for him.  He needs a place to go and live.  But I not worried about him coming home and doing anything to himself or somebody else."    Advisable for SW to follow up with spouse in the AM and provide additional resources.    Dr. Lolly Mustache to evaluate and determine if pt can be d/c home.

## 2013-04-06 NOTE — ED Notes (Signed)
Written dc instructions reviewed w/pt.  Pt encouraged to follow up w/his MD and return/seek treatment for any return of si/hi/avh.  Pt verbalized understanding. Pt ambulatory to dc window, belongings returned after leaving unit.

## 2013-04-06 NOTE — ED Notes (Signed)
Up to the bathroom to shower and change.  Pt declined medication for AH, and has declined to take potassium,but did agree to drink gatorade.

## 2013-04-06 NOTE — ED Notes (Signed)
Up to the desk on the phone 

## 2013-04-06 NOTE — ED Provider Notes (Signed)
Medical screening examination/treatment/procedure(s) were performed by non-physician practitioner and as supervising physician I was immediately available for consultation/collaboration.   Celene Kras, MD 04/06/13 [phone]

## 2013-04-06 NOTE — ED Notes (Signed)
Dr Arfeen into see 

## 2013-04-06 NOTE — Consult Note (Signed)
Reason for Consult: Insomnia Referring Physician: Dr. Aurther Loft  Johnny Clarke is an 37 y.o. male.  HPI: Patient is 37 year old African American man who was initially seen by [facility] as a walk with c/o of insomnia irritability and agitation.  Patient was referred to emergency room for medical clearance.  The patient denies any previous history of psychiatric illness.  Patient admitted worsening of insomnia since he has surgery in March.  Patient has Traumatic brain injury.  He admitted some time memory issues and does not remember things very well.  He endorse lack of sleep makes him sometime very agitated irritable but denies ever having any suicidal thoughts or homicidal thoughts.  Patient lives with his wife.  Patient admitted that he became angry 2 days ago for no reason because he was very tired.  There is a report that patient's wife concern patient threatens burning his own house however patient denies.  Patient admitted some time hearing noises particularly at night when he cannot sleep.  He denies any paranoia or any delusion.  He admitted drinking on the weekends but denies any binge drinking or any tremors or blackouts.  Patient slept well last night.  He wants to be discharged.  [facility]  Patient has a sleep study however he is not happy with the CPAP she because he feels it is not working.  I selected discussed with his primary care physician for his insomnia.  Patient denies any active or passive suicidal parts or homicidal thoughts.  We have contact his wife and she has no concern patient's coming back.  Mental status examination Patient is a middle-aged man who is casually dressed.  He appears to be his stated age.  His speech is fluent, clear and coherent.  His thought processes slow but logical and goal-directed.  He denies any active or passive suicidal parts or homicidal thoughts.  He denies any auditory or visual hallucination at this time.  He has some difficulty  remembering things and his attention and concentration is fair.  However there were no tremors or shakes.  There were no delusions or paranoid thinking at this time.  His fund of knowledge is fair.  He is alert and oriented x3.  His insight judgment and impulse control is okay.  Past Medical History  Diagnosis Date  . Mental disorder   . High cholesterol   . Chronic back pain   . Sleep apnea   . PTSD (post-traumatic stress disorder)   . Migraine     Takes topamax    Past Surgical History  Procedure Laterality Date  . Tonsillectomy    . Hemorrhoid surgery    . Tonsilectomy, adenoidectomy, bilateral myringotomy and tubes      tubes 16 times  . Lumbar laminectomy/decompression microdiscectomy Left 11/11/2012    Procedure: LUMBAR LAMINECTOMY/DECOMPRESSION MICRODISCECTOMY 1 LEVEL;  Surgeon: Temple Pacini, MD;  Location: [facility];  Service: Neurosurgery;  Laterality: Left;  left five sacral one    History reviewed. No pertinent family history.  Social History:  reports that he has been smoking Cigarettes.  He has a .6 pack-year smoking history. He does not have any smokeless tobacco history on file. He reports that he drinks about 3.6 ounces of alcohol per week. He reports that he does not use illicit drugs.  Allergies:  Allergies  Allergen Reactions  . Penicillins     Pt reports that he is allergic to all penicillins.    Medications: I  have reviewed the patient's current medications.  Results for orders placed during the hospital encounter of 04/05/13 (from the past 48 hour(s))  URINE RAPID DRUG SCREEN (HOSP PERFORMED)     Status: Abnormal   Collection Time    04/05/13  3:04 PM      Result Value Range   Opiates NONE DETECTED  NONE DETECTED   Cocaine NONE DETECTED  NONE DETECTED   Benzodiazepines NONE DETECTED  NONE DETECTED   Amphetamines NONE DETECTED  NONE DETECTED   Tetrahydrocannabinol NONE DETECTED  NONE DETECTED   Barbiturates POSITIVE (*) NONE DETECTED   Comment:             DRUG SCREEN FOR MEDICAL PURPOSES     ONLY.  IF CONFIRMATION IS NEEDED     FOR ANY PURPOSE, NOTIFY LAB     WITHIN 5 DAYS.                LOWEST DETECTABLE LIMITS     FOR URINE DRUG SCREEN     Drug Class       Cutoff (ng/mL)     Amphetamine      1000     Barbiturate      200     Benzodiazepine   200     Tricyclics       300     Opiates          300     Cocaine          300     THC              50  ACETAMINOPHEN LEVEL     Status: None   Collection Time    04/05/13  3:47 PM      Result Value Range   Acetaminophen (Tylenol), Serum <15.0  10 - 30 ug/mL   Comment:            THERAPEUTIC CONCENTRATIONS VARY     SIGNIFICANTLY. A RANGE OF 10-30     ug/mL MAY BE AN EFFECTIVE     CONCENTRATION FOR MANY PATIENTS.     HOWEVER, SOME ARE BEST TREATED     AT CONCENTRATIONS OUTSIDE THIS     RANGE.     ACETAMINOPHEN CONCENTRATIONS     >150 ug/mL AT 4 HOURS AFTER     INGESTION AND >50 ug/mL AT 12     HOURS AFTER INGESTION ARE     OFTEN ASSOCIATED WITH TOXIC     REACTIONS.  CBC     Status: Abnormal   Collection Time    04/05/13  3:47 PM      Result Value Range   WBC 5.2  4.0 - 10.5 K/uL   RBC 5.52  4.22 - 5.81 MIL/uL   Hemoglobin 17.7 (*) 13.0 - 17.0 g/dL   HCT 47.8  29.5 - 62.1 %   MCV 88.6  78.0 - 100.0 fL   MCH 32.1  26.0 - 34.0 pg   MCHC 36.2 (*) 30.0 - 36.0 g/dL   RDW 30.8  65.7 - 84.6 %   Platelets 191  150 - 400 K/uL  COMPREHENSIVE METABOLIC PANEL     Status: Abnormal   Collection Time    04/05/13  3:47 PM      Result Value Range   Sodium 139  135 - 145 mEq/L   Potassium 3.2 (*) 3.5 - 5.1 mEq/L   Chloride 104  96 - 112 mEq/L   CO2 23  19 -  32 mEq/L   Glucose, Bld 87  70 - 99 mg/dL   BUN 12  6 - 23 mg/dL   Creatinine, Ser 2.95  0.50 - 1.35 mg/dL   Calcium 9.3  8.4 - 28.4 mg/dL   Total Protein 7.6  6.0 - 8.3 g/dL   Albumin 4.2  3.5 - 5.2 g/dL   AST 33  0 - 37 U/L   ALT 25  0 - 53 U/L   Alkaline Phosphatase 72  39 - 117 U/L   Total Bilirubin 0.6  0.3 - 1.2  mg/dL   GFR calc non Af Amer 80 (*) >90 mL/min   GFR calc Af Amer >90  >90 mL/min   Comment:            The eGFR has been calculated     using the CKD EPI equation.     This calculation has not been     validated in all clinical     situations.     eGFR's persistently     <90 mL/min signify     possible Chronic Kidney Disease.  ETHANOL     Status: None   Collection Time    04/05/13  3:47 PM      Result Value Range   Alcohol, Ethyl (B) <11  0 - 11 mg/dL   Comment:            LOWEST DETECTABLE LIMIT FOR     SERUM ALCOHOL IS 11 mg/dL     FOR MEDICAL PURPOSES ONLY  SALICYLATE LEVEL     Status: Abnormal   Collection Time    04/05/13  3:47 PM      Result Value Range   Salicylate Lvl <2.0 (*) 2.8 - 20.0 mg/dL  URINALYSIS, ROUTINE W REFLEX MICROSCOPIC     Status: None   Collection Time    04/05/13  4:53 PM      Result Value Range   Color, Urine YELLOW  YELLOW   APPearance CLEAR  CLEAR   Specific Gravity, Urine 1.023  1.005 - 1.030   pH 6.0  5.0 - 8.0   Glucose, UA NEGATIVE  NEGATIVE mg/dL   Hgb urine dipstick NEGATIVE  NEGATIVE   Bilirubin Urine NEGATIVE  NEGATIVE   Ketones, ur NEGATIVE  NEGATIVE mg/dL   Protein, ur NEGATIVE  NEGATIVE mg/dL   Urobilinogen, UA 1.0  0.0 - 1.0 mg/dL   Nitrite NEGATIVE  NEGATIVE   Leukocytes, UA NEGATIVE  NEGATIVE   Comment: MICROSCOPIC NOT DONE ON URINES WITH NEGATIVE PROTEIN, BLOOD, LEUKOCYTES, NITRITE, OR GLUCOSE <1000 mg/dL.    No results found.  Review of Systems  Psychiatric/Behavioral: Positive for memory loss. Negative for depression, suicidal ideas, hallucinations and substance abuse. The patient is nervous/anxious and has insomnia.    Blood pressure 117/69, pulse 53, temperature 98 F (36.7 C), temperature source Oral, resp. rate 18, SpO2 99.00%. 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  Insomnia  Plan Patient does not require inpatient psychiatric treatment at this time.  He like  to be discharge.  [facility]  [facility]  At this time patient has not had any psychosis, suicidal thoughts and homicidal thoughts.  Recommended outpatient services.  Lorraine Cimmino T. 04/06/2013, 12:02 PM

## 2014-02-13 ENCOUNTER — Ambulatory Visit: Payer: Self-pay | Admitting: Cardiology

## 2014-03-04 ENCOUNTER — Encounter: Payer: Self-pay | Admitting: Cardiology

## 2014-03-04 ENCOUNTER — Ambulatory Visit (INDEPENDENT_AMBULATORY_CARE_PROVIDER_SITE_OTHER): Payer: 59 | Admitting: Cardiology

## 2014-03-04 ENCOUNTER — Telehealth: Payer: Self-pay | Admitting: Cardiology

## 2014-03-04 VITALS — BP 128/100 | HR 60 | Ht 70.0 in | Wt 232.0 lb

## 2014-03-04 DIAGNOSIS — S0990XS Unspecified injury of head, sequela: Secondary | ICD-10-CM

## 2014-03-04 DIAGNOSIS — F09 Unspecified mental disorder due to known physiological condition: Secondary | ICD-10-CM

## 2014-03-04 DIAGNOSIS — G4733 Obstructive sleep apnea (adult) (pediatric): Secondary | ICD-10-CM | POA: Insufficient documentation

## 2014-03-04 DIAGNOSIS — F4310 Post-traumatic stress disorder, unspecified: Secondary | ICD-10-CM

## 2014-03-04 DIAGNOSIS — IMO0001 Reserved for inherently not codable concepts without codable children: Secondary | ICD-10-CM

## 2014-03-04 DIAGNOSIS — G4700 Insomnia, unspecified: Secondary | ICD-10-CM

## 2014-03-04 NOTE — Telephone Encounter (Signed)
New message ° ° ° ° ° ° °Pt returning nurse call  °

## 2014-03-04 NOTE — Patient Instructions (Signed)
We will Call your PCP to get them to refer [facility] to [facility] to see Dr Dohmeier [address], [place], [place] [address] [phone]  Also in 45 days call us and we will fax over your records for them if you want.  Call and ask for Saory Carriero C [phone]
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
## 2014-03-05 NOTE — Telephone Encounter (Signed)
Made pt aware we faxed over info for his referral.

## 2014-04-17 ENCOUNTER — Ambulatory Visit (INDEPENDENT_AMBULATORY_CARE_PROVIDER_SITE_OTHER): Payer: 59 | Admitting: Licensed Clinical Social Worker

## 2014-04-17 DIAGNOSIS — F3189 Other bipolar disorder: Secondary | ICD-10-CM | POA: Diagnosis not present

## 2014-11-23 ENCOUNTER — Emergency Department (HOSPITAL_BASED_OUTPATIENT_CLINIC_OR_DEPARTMENT_OTHER)
Admission: EM | Admit: 2014-11-23 | Discharge: 2014-11-23 | Disposition: A | Discharge status: Home / Self Care | Payer: Non-veteran care | Attending: Emergency Medicine | Admitting: Emergency Medicine

## 2014-11-23 ENCOUNTER — Encounter (HOSPITAL_BASED_OUTPATIENT_CLINIC_OR_DEPARTMENT_OTHER): Payer: Self-pay

## 2014-11-23 DIAGNOSIS — Z7952 Long term (current) use of systemic steroids: Secondary | ICD-10-CM | POA: Diagnosis not present

## 2014-11-23 DIAGNOSIS — Z79899 Other long term (current) drug therapy: Secondary | ICD-10-CM | POA: Insufficient documentation

## 2014-11-23 DIAGNOSIS — Z720 Tobacco use: Secondary | ICD-10-CM | POA: Diagnosis not present

## 2014-11-23 DIAGNOSIS — E780 Pure hypercholesterolemia: Secondary | ICD-10-CM | POA: Insufficient documentation

## 2014-11-23 DIAGNOSIS — R51 Headache: Secondary | ICD-10-CM | POA: Diagnosis present

## 2014-11-23 DIAGNOSIS — G8929 Other chronic pain: Secondary | ICD-10-CM | POA: Insufficient documentation

## 2014-11-23 DIAGNOSIS — G43009 Migraine without aura, not intractable, without status migrainosus: Secondary | ICD-10-CM | POA: Insufficient documentation

## 2014-11-23 DIAGNOSIS — Z8659 Personal history of other mental and behavioral disorders: Secondary | ICD-10-CM | POA: Insufficient documentation

## 2014-11-23 DIAGNOSIS — J029 Acute pharyngitis, unspecified: Secondary | ICD-10-CM | POA: Diagnosis not present

## 2014-11-23 DIAGNOSIS — Z880 Allergy status to penicillin: Secondary | ICD-10-CM | POA: Insufficient documentation

## 2014-11-23 DIAGNOSIS — Z791 Long term (current) use of non-steroidal anti-inflammatories (NSAID): Secondary | ICD-10-CM | POA: Diagnosis not present

## 2014-11-23 DIAGNOSIS — G43119 Migraine with aura, intractable, without status migrainosus: Secondary | ICD-10-CM

## 2014-11-23 LAB — RAPID STREP SCREEN (MED CTR MEBANE ONLY): Streptococcus, Group A Screen (Direct): NEGATIVE

## 2014-11-23 MED ORDER — METOCLOPRAMIDE HCL 5 MG/ML IJ SOLN
10.0000 mg | Freq: Once | INTRAMUSCULAR | Status: DC
  Filled 2014-11-23: qty 2

## 2014-11-23 MED ORDER — KETOROLAC TROMETHAMINE 30 MG/ML IJ SOLN
60.0000 mg | Freq: Once | INTRAMUSCULAR | Status: DC
  Filled 2014-11-23: qty 2

## 2014-11-23 MED ORDER — DIPHENHYDRAMINE HCL 50 MG/ML IJ SOLN
25.0000 mg | Freq: Once | INTRAMUSCULAR | Status: DC
  Filled 2014-11-23: qty 1

## 2014-11-23 NOTE — ED Provider Notes (Signed)
CSN: [id number]     Arrival date & time 11/23/14  1016 History   First MD Initiated Contact with Patient 11/23/14 1022     Chief Complaint  Patient presents with  . Headache     (Consider location/radiation/quality/duration/timing/severity/associated sxs/prior Treatment) HPI Comments: Pt comes in with c/o headache and sore throat that started yesterday. Denies fever. States that he had 2 episodes of vomiting. Had aura which is similar to previous migraines. Has taken flexeril, fioricet and promethazine. Denies fever, neck stiffness, visual changes, syncope, numbness or weakness.  The history is provided by the patient. No language interpreter was used.    Past Medical History  Diagnosis Date  . Mental disorder   . High cholesterol   . Chronic back pain   . Sleep apnea   . PTSD (post-traumatic stress disorder)   . Migraine     Takes topamax   Past Surgical History  Procedure Laterality Date  . Tonsillectomy    . Hemorrhoid surgery    . Tonsilectomy, adenoidectomy, bilateral myringotomy and tubes      tubes 16 times  . Lumbar laminectomy/decompression microdiscectomy Left 11/11/2012    Procedure: LUMBAR LAMINECTOMY/DECOMPRESSION MICRODISCECTOMY 1 LEVEL;  Surgeon: Temple Pacini, MD;  Location: [facility];  Service: Neurosurgery;  Laterality: Left;  left five sacral one   No family history on file. History  Substance Use Topics  . Smoking status: Current Every Day Smoker -- 0.20 packs/day for 3 years    Types: Cigarettes  . Smokeless tobacco: Not on file  . Alcohol Use: 3.6 oz/week    6 Cans of beer per week    Review of Systems  All other systems reviewed and are negative.     Allergies  Penicillins  Home Medications   Prior to Admission medications   Medication Sig Start Date End Date Taking? Authorizing Provider  butalbital-acetaminophen-caffeine (FIORICET, ESGIC) 50-325-40 MG per tablet Take 2 tablets by mouth 2 (two) times daily as needed for headache.     Historical Provider, MD  docusate sodium (COLACE) 100 MG capsule Take 100 mg by mouth 2 (two) times daily.    Historical Provider, MD  doxepin (SINEQUAN) 50 MG capsule Take 50 mg by mouth at bedtime.    Historical Provider, MD  flunisolide (NASALIDE) 25 MCG/ACT (0.025%) SOLN Place 2 sprays into the nose 2 (two) times daily.    Historical Provider, MD  lidocaine (LIDODERM) 5 % Place 1 patch onto the skin daily. Remove & Discard patch within 12 hours or as directed by MD    Historical Provider, MD  meloxicam (MOBIC) 7.5 MG tablet Take 7.5 mg by mouth daily.    Historical Provider, MD  OnabotulinumtoxinA (BOTOX IJ) Inject as directed every 3 (three) months.    Historical Provider, MD  polyethylene glycol (MIRALAX / GLYCOLAX) packet Take 17 g by mouth daily.    Historical Provider, MD  promethazine (PHENERGAN) 25 MG tablet Take 25 mg by mouth every 6 (six) hours as needed for nausea or vomiting.    Historical Provider, MD  simvastatin (ZOCOR) 40 MG tablet Take 40 mg by mouth daily. Take 1/2 daily    Historical Provider, MD  temazepam (RESTORIL) 30 MG capsule Take 30 mg by mouth at bedtime as needed for sleep.    Historical Provider, MD  triamcinolone lotion (KENALOG) 0.1 % Apply 1 application topically 3 (three) times daily.    Historical Provider, MD   BP 129/90 mmHg  Pulse 93  Temp(Src) 97.5 F (36.4  C) (Oral)  Resp 16  Ht 5\' 11"  (1.803 m)  Wt 231 lb (104.781 kg)  BMI 32.23 kg/m2  SpO2 100% Physical Exam  Constitutional: He is oriented to person, place, and time. He appears well-developed and well-nourished.  HENT:  Head: Normocephalic and atraumatic.  Right Ear: External ear normal.  Left Ear: External ear normal.  Mouth/Throat: Posterior oropharyngeal erythema present.  Eyes: Conjunctivae and EOM are normal. Pupils are equal, round, and reactive to light.  Neck: Normal range of motion. Neck supple.  Cardiovascular: Normal rate.   Pulmonary/Chest: Effort normal and breath sounds  normal.  Musculoskeletal: Normal range of motion.  Neurological: He is alert and oriented to person, place, and time.  Skin: Skin is warm and dry.  Nursing note and vitals reviewed.   ED Course  Procedures (including critical care time) Labs Review Labs Reviewed  RAPID STREP SCREEN    Imaging Review No results found.   EKG Interpretation None      MDM   Final diagnoses:  Intractable migraine with aura without status migrainosus  Pharyngitis    Strep negative. Pt is refusing any medications for the headache. Pt is neurologically intact. Pt has pcp follow up as needed   Teressa LowerVrinda Hermen Mario, NP 11/23/14 1108  Blane OharaJoshua Zavitz, MD 11/25/14 [phone]

## 2014-11-23 NOTE — ED Notes (Signed)
Reports headache and sore throat since last night. Denies visual disturbances. Reports vomiting and fever last night. Took muscle relaxer, promethazine and HA medication

## 2014-11-23 NOTE — ED Notes (Signed)
NP at bedside. This RN went in to administer the medication, pt refusing medications at this time. Sts "what are they for? You can give me anything for my headache."  NP made aware

## 2014-11-23 NOTE — ED Notes (Signed)
Pt was not found in the room for discharge papers.

## 2014-11-23 NOTE — Discharge Instructions (Signed)
General Headache Without Cause A headache is pain or discomfort felt around the head or neck area. The specific cause of a headache may not be found. There are many causes and types of headaches. A few common ones are:  Tension headaches.  Migraine headaches.  Cluster headaches.  Chronic daily headaches. HOME CARE INSTRUCTIONS   Keep all follow-up appointments with your caregiver or any specialist referral.  Only take over-the-counter or prescription medicines for pain or discomfort as directed by your caregiver.  Lie down in a dark, quiet room when you have a headache.  Keep a headache journal to find out what may trigger your migraine headaches. For example, write down:  What you eat and drink.  How much sleep you get.  Any change to your diet or medicines.  Try massage or other relaxation techniques.  Put ice packs or heat on the head and neck. Use these 3 to 4 times per day for 15 to 20 minutes each time, or as needed.  Limit stress.  Sit up straight, and do not tense your muscles.  Quit smoking if you smoke.  Limit alcohol use.  Decrease the amount of caffeine you drink, or stop drinking caffeine.  Eat and sleep on a regular schedule.  Get 7 to 9 hours of sleep, or as recommended by your caregiver.  Keep lights dim if bright lights bother you and make your headaches worse. SEEK MEDICAL CARE IF:   You have problems with the medicines you were prescribed.  Your medicines are not working.  You have a change from the usual headache.  You have nausea or vomiting. SEEK IMMEDIATE MEDICAL CARE IF:   Your headache becomes severe.  You have a fever.  You have a stiff neck.  You have loss of vision.  You have muscular weakness or loss of muscle control.  You start losing your balance or have trouble walking.  You feel faint or pass out.  You have severe symptoms that are different from your first symptoms. MAKE SURE YOU:   Understand these  instructions.  Will watch your condition.  Will get help right away if you are not doing well or get worse. Document Released: 08/21/2005 Document Revised: 11/13/2011 Document Reviewed: 09/06/2011 [facility] Patient Information 2015 [place], [place]. This information is not intended to replace advice given to you by your health care provider. Make sure you discuss any questions you have with your health care provider.  Pharyngitis Pharyngitis is redness, pain, and swelling (inflammation) of your pharynx.  CAUSES  Pharyngitis is usually caused by infection. Most of the time, these infections are from viruses (viral) and are part of a cold. However, sometimes pharyngitis is caused by bacteria (bacterial). Pharyngitis can also be caused by allergies. Viral pharyngitis may be spread from person to person by coughing, sneezing, and personal items or utensils (cups, forks, spoons, toothbrushes). Bacterial pharyngitis may be spread from person to person by more intimate contact, such as kissing.  SIGNS AND SYMPTOMS  Symptoms of pharyngitis include:   Sore throat.   Tiredness (fatigue).   Low-grade fever.   Headache.  Joint pain and muscle aches.  Skin rashes.  Swollen lymph nodes.  Plaque-like film on throat or tonsils (often seen with bacterial pharyngitis). DIAGNOSIS  Your health care provider will ask you questions about your illness and your symptoms. Your medical history, along with a physical exam, is often all that is needed to diagnose pharyngitis. Sometimes, a rapid strep test is done. Other lab tests  may also be done, depending on the suspected cause.  TREATMENT  Viral pharyngitis will usually get better in 3-4 days without the use of medicine. Bacterial pharyngitis is treated with medicines that kill germs (antibiotics).  HOME CARE INSTRUCTIONS   Drink enough water and fluids to keep your urine clear or pale yellow.   Only take over-the-counter or prescription medicines as  directed by your health care provider:   If you are prescribed antibiotics, make sure you finish them even if you start to feel better.   Do not take aspirin.   Get lots of rest.   Gargle with 8 oz of salt water ( tsp of salt per 1 qt of water) as often as every 1-2 hours to soothe your throat.   Throat lozenges (if you are not at risk for choking) or sprays may be used to soothe your throat. SEEK MEDICAL CARE IF:   You have large, tender lumps in your neck.  You have a rash.  You cough up green, yellow-brown, or bloody spit. SEEK IMMEDIATE MEDICAL CARE IF:   Your neck becomes stiff.  You drool or are unable to swallow liquids.  You vomit or are unable to keep medicines or liquids down.  You have severe pain that does not go away with the use of recommended medicines.  You have trouble breathing (not caused by a stuffy nose). MAKE SURE YOU:   Understand these instructions.  Will watch your condition.  Will get help right away if you are not doing well or get worse. Document Released: 08/21/2005 Document Revised: 06/11/2013 Document Reviewed: 04/28/2013 [facility] Patient Information 2015 [place]. This information is not intended to replace advice given to you by your health care provider. Make sure you discuss any questions you have with your health care provider.

## 2014-11-25 LAB — CULTURE, GROUP A STREP

## 2019-06-27 ENCOUNTER — Other Ambulatory Visit: Payer: Self-pay

## 2019-06-27 DIAGNOSIS — Z20822 Contact with and (suspected) exposure to covid-19: Secondary | ICD-10-CM

## 2019-06-29 LAB — NOVEL CORONAVIRUS, NAA: SARS-CoV-2, NAA: DETECTED — AB

## 2020-07-22 ENCOUNTER — Encounter: Payer: Self-pay | Admitting: Physical Medicine & Rehabilitation

## 2020-08-19 ENCOUNTER — Ambulatory Visit (INDEPENDENT_AMBULATORY_CARE_PROVIDER_SITE_OTHER): Payer: No Typology Code available for payment source

## 2020-08-19 ENCOUNTER — Ambulatory Visit (HOSPITAL_COMMUNITY)
Admission: EM | Admit: 2020-08-19 | Discharge: 2020-08-19 | Disposition: A | Discharge status: Home / Self Care | Payer: No Typology Code available for payment source | Attending: Family Medicine | Admitting: Family Medicine

## 2020-08-19 ENCOUNTER — Other Ambulatory Visit: Payer: Self-pay

## 2020-08-19 ENCOUNTER — Encounter (HOSPITAL_COMMUNITY): Payer: Self-pay

## 2020-08-19 DIAGNOSIS — R053 Chronic cough: Secondary | ICD-10-CM | POA: Diagnosis not present

## 2020-08-19 NOTE — ED Provider Notes (Signed)
[facility]    CSN: [id number] Arrival date & time: 08/19/20  1848      History   Chief Complaint Chief Complaint  Patient presents with  . Sore Throat    HPI Johnny Clarke is a 44 y.o. male.   He is presenting with chronic cough.  His symptoms been ongoing for about a year.  He has been seen at the [place] and try different therapies with no improvement.  Denies any tobacco use.  Has used alcohol but sparingly.  His cough is occurring day and night.  Has been tested for pulmonary function test.  HPI  Past Medical History:  Diagnosis Date  . Chronic back pain   . High cholesterol   . Mental disorder   . Migraine    Takes topamax  . PTSD (post-traumatic stress disorder)   . Sleep apnea     Patient Active Problem List   Diagnosis Date Noted  . OSA (obstructive sleep apnea) 03/04/2014  . Insomnia 03/04/2014  . 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 PTSD (post-traumatic stress disorder) 03/04/2014    Past Surgical History:  Procedure Laterality Date  . HEMORRHOID SURGERY    . LUMBAR LAMINECTOMY/DECOMPRESSION MICRODISCECTOMY Left 11/11/2012   Procedure: LUMBAR LAMINECTOMY/DECOMPRESSION MICRODISCECTOMY 1 LEVEL;  Surgeon: Temple Pacini, MD;  Location: [facility];  Service: Neurosurgery;  Laterality: Left;  left five sacral one  . SPINAL FUSION    . TONSILECTOMY, ADENOIDECTOMY, BILATERAL MYRINGOTOMY AND TUBES     tubes 16 times  . TONSILLECTOMY         Home Medications    Prior to Admission medications   Medication Sig Start Date End Date Taking? Authorizing Provider  butalbital-acetaminophen-caffeine (FIORICET, ESGIC) 50-325-40 MG per tablet Take 2 tablets by mouth 2 (two) times daily as needed for headache.    [provider]  docusate sodium (COLACE) 100 MG capsule Take 100 mg by mouth 2 (two) times daily.    [provider]  doxepin (SINEQUAN) 50 MG capsule Take 50 mg by mouth at bedtime.    [provider]   flunisolide (NASALIDE) 25 MCG/ACT (0.025%) SOLN Place 2 sprays into the nose 2 (two) times daily.    [provider]  lidocaine (LIDODERM) 5 % Place 1 patch onto the skin daily. Remove & Discard patch within 12 hours or as directed by MD    [provider]  meloxicam (MOBIC) 7.5 MG tablet Take 7.5 mg by mouth daily.    [provider]  OnabotulinumtoxinA (BOTOX IJ) Inject as directed every 3 (three) months.    [provider]  polyethylene glycol (MIRALAX / GLYCOLAX) packet Take 17 g by mouth daily.    [provider]  promethazine (PHENERGAN) 25 MG tablet Take 25 mg by mouth every 6 (six) hours as needed for nausea or vomiting.    [provider]  simvastatin (ZOCOR) 40 MG tablet Take 40 mg by mouth daily. Take 1/2 daily    [provider]  temazepam (RESTORIL) 30 MG capsule Take 30 mg by mouth at bedtime as needed for sleep.    [provider]  triamcinolone lotion (KENALOG) 0.1 % Apply 1 application topically 3 (three) times daily.    [provider]    Family History No family history on file.  Social History Social History   Tobacco Use  . Smoking status: Current Every Day Smoker    Packs/day: 0.20    Years: 3.00  Pack years: 0.60    Types: Cigarettes  Substance Use Topics  . Alcohol use: Yes    Alcohol/week: 6.0 standard drinks    Types: 6 Cans of beer per week  . Drug use: No     Allergies   Penicillins   Review of Systems Review of Systems  See HPI  Physical Exam Triage Vital Signs ED Triage Vitals  Enc Vitals Group     BP 08/19/20 2007 (!) 131/92     Pulse Rate 08/19/20 2007 73     Resp 08/19/20 2007 16     Temp 08/19/20 2007 98.5 F (36.9 C)     Temp Source 08/19/20 2007 Oral     SpO2 08/19/20 2007 100 %     Weight --      Height --      Head Circumference --      Peak Flow --      Pain Score 08/19/20 2004 7     Pain Loc --      Pain Edu? --      Excl. in GC? --     No data found.  Updated Vital Signs BP (!) 131/92 (BP Location: Right Arm)   Pulse 73   Temp 98.5 F (36.9 C) (Oral)   Resp 16   SpO2 100%   Visual Acuity Right Eye Distance:   Left Eye Distance:   Bilateral Distance:    Right Eye Near:   Left Eye Near:    Bilateral Near:     Physical Exam Gen: NAD, alert, cooperative with exam, well-appearing ENT: normal lips, normal nasal mucosa,  Eye: normal EOM, normal conjunctiva and lids CV: Regular rate and rhythm Resp: no accessory muscle use, non-labored,  Skin: no rashes, no areas of induration  Neuro: normal tone, normal sensation to touch Psych:  normal insight, alert and oriented    UC Treatments / Results  Labs (all labs ordered are listed, but only abnormal results are displayed) Labs Reviewed - No data to display  EKG   Radiology DG Chest 2 View  Result Date: 08/19/2020 CLINICAL DATA:  Chronic cough EXAM: CHEST - 2 VIEW COMPARISON:  03/21/2013 FINDINGS: Heart and mediastinal contours are within normal limits. No focal opacities or effusions. No acute bony abnormality. IMPRESSION: No active cardiopulmonary disease. Electronically Signed   By: Charlett Nose M.D.   On: 08/19/2020 20:40    Procedures Procedures (including critical care time)  Medications Ordered in UC Medications - No data to display  Initial Impression / Assessment and Plan / UC Course  I have reviewed the triage vital signs and the nursing notes.  Pertinent labs & imaging results that were available during my care of the patient were reviewed by me and considered in my medical decision making (see chart for details).     Johnny Clarke is a 44 year old male that is presenting with a chronic cough.  He has been treated at the [place] but continues to have symptoms.  Imaging of his chest was negative.  Counseled on follow-up with ENT.  Counseled supportive care.  Given indications on follow-up.  Final Clinical Impressions(s) / UC Diagnoses   Final  diagnoses:  Chronic cough     Discharge Instructions     Please try being seen by the ENT doctor      ED Prescriptions    None     PDMP not reviewed this encounter.   Myra Rude, MD 08/19/20 2116

## 2020-08-19 NOTE — ED Triage Notes (Signed)
Pt presents with a cough X 10 months that is on and off. Pt states he has been in the [place] to be seen for this sx. Pt states he ahs tried coughs syrups and albuterol as well as cough drops.Pt states he is concerned about throat cancer since his father died from it. Pt states he coughs up yellow dn brown mucus when he wakes up and at bed time. Pt denies chest pain.

## 2020-08-19 NOTE — Discharge Instructions (Signed)
Please try being seen by the ENT doctor

## 2020-09-10 ENCOUNTER — Encounter
Payer: No Typology Code available for payment source | Attending: Physical Medicine & Rehabilitation | Admitting: Physical Medicine & Rehabilitation

## 2020-09-10 ENCOUNTER — Other Ambulatory Visit: Payer: Self-pay

## 2020-09-10 ENCOUNTER — Encounter: Payer: Self-pay | Admitting: Physical Medicine & Rehabilitation

## 2020-09-10 VITALS — BP 123/75 | HR 77 | Temp 99.1°F | Ht 71.0 in | Wt 220.0 lb

## 2020-09-10 DIAGNOSIS — M961 Postlaminectomy syndrome, not elsewhere classified: Secondary | ICD-10-CM | POA: Insufficient documentation
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
## 2020-09-10 NOTE — Patient Instructions (Signed)
Plan is to start with L2-3-4 medial branch blocks on Left side  Try heating pad every morning for 20-35min, followed by your exercises  May use TENs later in the day

## 2020-09-24 ENCOUNTER — Encounter: Payer: Self-pay | Admitting: Physical Medicine & Rehabilitation

## 2020-09-28 ENCOUNTER — Encounter: Payer: Self-pay | Admitting: Physical Medicine & Rehabilitation

## 2020-09-28 ENCOUNTER — Encounter (HOSPITAL_BASED_OUTPATIENT_CLINIC_OR_DEPARTMENT_OTHER): Payer: No Typology Code available for payment source | Admitting: Physical Medicine & Rehabilitation

## 2020-09-28 ENCOUNTER — Other Ambulatory Visit: Payer: Self-pay

## 2020-09-28 VITALS — BP 121/77 | HR 69 | Temp 98.6°F | Ht 71.0 in | Wt 222.0 lb

## 2020-09-28 DIAGNOSIS — M961 Postlaminectomy syndrome, not elsewhere classified: Secondary | ICD-10-CM

## 2020-09-28 NOTE — Progress Notes (Signed)
  PROCEDURE RECORD [facility]   Name: Johnny Clarke DOB:[birth year] MRN: [id number]  Date:09/28/2020  Physician: Claudette Laws, MD    Nurse/CMA: Nedra Hai, CMA  Allergies:  Allergies  Allergen Reactions  . Amoxicillin Anaphylaxis, Rash and Shortness Of Breath  . Penicillin G Anaphylaxis and Shortness Of Breath  . Penicillins     Pt reports that he is allergic to all penicillins.    Consent Signed: Yes.    Is patient diabetic? No.  CBG today?   Pregnant: No. LMP: No LMP for male patient. (age 86-55)  Anticoagulants: no Anti-inflammatory: no Antibiotics: no  Procedure:Left 2,3 Medial Branch Block  Position: Prone Start Time: 9:48am End Time:9:53am  Fluoro Time: 31  RN/CMA Letitia Sabala,CMA Jaelynn Pozo,CMA    Time 9:25am 10:00am    BP 121/77 149/84    Pulse 69 63    Respirations 16 16    O2 Sat 96 98    S/S 6 6    Pain Level 7/10 7/10     D/C home with wife, [name] & O X 3, D/C instructions reviewed, and sits independently.

## 2020-09-28 NOTE — Progress Notes (Signed)
Left Lumbar L2,  L3,   medial branch blocks  under fluoroscopic guidance   Indication: Left Lumbar pain which is not relieved by medication management or other conservative care and interfering with self-care and mobility.  Informed consent was obtained after describing risks and benefits of the procedure with the patient, this includes bleeding, bruising, infection, paralysis and medication side effects.  The patient wishes to proceed and has given written consent.  The patient was placed in a prone position.  The lumbar area was marked and prepped with Betadine.  One mL of 1% lidocaine was injected into each of  areas into the skin and subcutaneous tissue.  Then a 22-gauge 3.5in spinal needle was inserted targeting the left L4 superior articular process in transverse process junction was targeted.  Bone contact was made.  Isovue 200 was injected x 0.5 mL demonstrating no intravascular uptake.  Then a solution containing  2% MPF lidocaine was injected x 0.5 mL. Then a 22-gauge 3.5in spinal needle was inserted targeting the left L3 superior articular process in transverse process junction was targeted.  Bone contact was made.  Isovue 200 was injected x 0.5 mL demonstrating no intravascular uptake.  Then a solution containing  2% MPF lidocaine was injected x 0.5 mL Due to extensive hardware at L5 this level was not visualized and not performed.  Patient tolerated procedure well.  Post procedure instructions were given.

## 2020-09-28 NOTE — Patient Instructions (Signed)

## 2020-10-21 ENCOUNTER — Encounter
Payer: No Typology Code available for payment source | Attending: Physical Medicine & Rehabilitation | Admitting: Physical Medicine & Rehabilitation

## 2020-10-21 ENCOUNTER — Encounter: Payer: Self-pay | Admitting: Physical Medicine & Rehabilitation

## 2020-10-21 ENCOUNTER — Other Ambulatory Visit: Payer: Self-pay

## 2020-10-21 VITALS — BP 132/88 | HR 69 | Temp 98.0°F | Ht 71.0 in | Wt 214.8 lb

## 2020-10-21 DIAGNOSIS — M961 Postlaminectomy syndrome, not elsewhere classified: Secondary | ICD-10-CM
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
## 2020-10-21 NOTE — Patient Instructions (Signed)
Will do epidural injection to help with left sided back pain which radiates to the Left thigh

## 2020-11-18 ENCOUNTER — Encounter: Payer: Self-pay | Admitting: Physical Medicine & Rehabilitation

## 2020-11-18 ENCOUNTER — Encounter
Payer: No Typology Code available for payment source | Attending: Physical Medicine & Rehabilitation | Admitting: Physical Medicine & Rehabilitation

## 2020-11-18 ENCOUNTER — Other Ambulatory Visit: Payer: Self-pay

## 2020-11-18 DIAGNOSIS — M961 Postlaminectomy syndrome, not elsewhere classified: Secondary | ICD-10-CM | POA: Diagnosis present

## 2020-11-18 DIAGNOSIS — M5416 Radiculopathy, lumbar region: Secondary | ICD-10-CM | POA: Insufficient documentation

## 2020-11-18 NOTE — Progress Notes (Signed)
LEFT L4-5 Lumbar transforaminal epidural steroid injection under fluoroscopic guidance with contrast enhancement  Indication: Lumbosacral radiculitis is not relieved by medication management or other conservative care and interfering with self-care and mobility.   Informed consent was obtained after describing risk and benefits of the procedure with the patient, this includes bleeding, bruising, infection, paralysis and medication side effects.  The patient wishes to proceed and has given written consent.  Patient was placed in prone position.  The lumbar area was marked and prepped with Betadine.  It was entered with a 25-gauge 1-1/2 inch needle and one mL of 1% lidocaine was injected into the skin and subcutaneous tissue.  Then a 22-gauge 5 in spinal needle was inserted into the Left L4-5  intervertebral foramen under AP, lateral, and oblique view.  Once needle tip was within the foramen on lateral views an dnor exceeding 6 o clock position on th epedical on AP viewed Isovue 200 was inected x 45ml Then a solution containing one mL of 10 mg per mL dexamethasone and 2 mL of 1% lidocaine was injected.  The patient tolerated procedure well.  Post procedure instructions were given.  Please see post procedure form.

## 2020-11-18 NOTE — Patient Instructions (Signed)

## 2020-11-18 NOTE — Progress Notes (Signed)
  PROCEDURE RECORD [facility]   Name: Jeris Roser DOB:November 01, 1975 MRN: [id number]  Date:11/18/2020  Physician: Claudette Laws, MD    Nurse/CMA: Nedra Hai, CMA  Allergies:  Allergies  Allergen Reactions  . Amoxicillin Anaphylaxis, Rash and Shortness Of Breath  . Penicillin G Anaphylaxis and Shortness Of Breath  . Penicillins     Pt reports that he is allergic to all penicillins.    Consent Signed: Yes.    Is patient diabetic? No.  CBG today?   Pregnant: No. LMP: No LMP for male patient. (age 53-55)  Anticoagulants: no Anti-inflammatory: no Antibiotics: no  Procedure:Left L4-5 Transforaminal Epidural Steroid Injection  Position: Prone Start Time:1:32pm  End Time: 1:36pm Fluoro Time:18  RN/CMA Nedra Hai, CMA Taisei Bonnette, CMA    Time 1:18pm 1:40pm    BP 109/78 134/87    Pulse 91 80    Respirations 16 16    O2 Sat 96 97    S/S 6 6    Pain Level 7/10 5/10     D/C home with wife, [name] & O X 3, D/C instructions reviewed, and sits independently.

## 2020-12-01 ENCOUNTER — Other Ambulatory Visit: Payer: Self-pay

## 2020-12-01 ENCOUNTER — Ambulatory Visit (HOSPITAL_BASED_OUTPATIENT_CLINIC_OR_DEPARTMENT_OTHER)
Payer: No Typology Code available for payment source | Attending: Physical Medicine & Rehabilitation | Admitting: Physical Therapy

## 2020-12-01 ENCOUNTER — Encounter (HOSPITAL_BASED_OUTPATIENT_CLINIC_OR_DEPARTMENT_OTHER): Payer: Self-pay | Admitting: Physical Therapy

## 2020-12-01 DIAGNOSIS — M6281 Muscle weakness (generalized): Secondary | ICD-10-CM | POA: Diagnosis present

## 2020-12-01 DIAGNOSIS — M5442 Lumbago with sciatica, left side: Secondary | ICD-10-CM | POA: Diagnosis not present

## 2020-12-01 DIAGNOSIS — R262 Difficulty in walking, not elsewhere classified: Secondary | ICD-10-CM | POA: Diagnosis present

## 2020-12-01 DIAGNOSIS — G8929 Other chronic pain: Secondary | ICD-10-CM | POA: Diagnosis present

## 2020-12-02 ENCOUNTER — Encounter (HOSPITAL_BASED_OUTPATIENT_CLINIC_OR_DEPARTMENT_OTHER): Payer: Self-pay | Admitting: Physical Therapy
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
## 2020-12-06 ENCOUNTER — Ambulatory Visit (HOSPITAL_BASED_OUTPATIENT_CLINIC_OR_DEPARTMENT_OTHER)
Payer: No Typology Code available for payment source | Attending: Physical Medicine & Rehabilitation | Admitting: Physical Therapy

## 2020-12-06 ENCOUNTER — Encounter (HOSPITAL_BASED_OUTPATIENT_CLINIC_OR_DEPARTMENT_OTHER): Payer: Self-pay | Admitting: Physical Therapy

## 2020-12-06 ENCOUNTER — Other Ambulatory Visit: Payer: Self-pay

## 2020-12-06 DIAGNOSIS — M6281 Muscle weakness (generalized): Secondary | ICD-10-CM | POA: Insufficient documentation

## 2020-12-06 DIAGNOSIS — R262 Difficulty in walking, not elsewhere classified: Secondary | ICD-10-CM | POA: Diagnosis present

## 2020-12-06 DIAGNOSIS — M5442 Lumbago with sciatica, left side: Secondary | ICD-10-CM | POA: Diagnosis not present

## 2020-12-06 DIAGNOSIS — G8929 Other chronic pain: Secondary | ICD-10-CM | POA: Insufficient documentation

## 2020-12-06 NOTE — Therapy (Signed)
[facility] [address] [place], [place], [address] Phone: [phone]   Fax:  [phone]  Physical Therapy Treatment  Patient Details  Name: Johnny Clarke MRN: [id number] Date of Birth: 1975-09-07 Referring Provider (PT): Wynn Banker Victorino Sparrow, MD   Encounter Date: 12/06/2020   PT End of Session - 12/06/20 1103    Visit Number 2    Number of Visits 17    Date for PT Re-Evaluation 01/26/21    Authorization Type [place]    PT Start Time 1102    PT Stop Time 1142    PT Time Calculation (min) 40 min    Activity Tolerance Patient tolerated treatment well    Behavior During Therapy [facility] for tasks assessed/performed           Past Medical History:  Diagnosis Date  . Chronic back pain   . High cholesterol   . Mental disorder   . Migraine    Takes topamax  . PTSD (post-traumatic stress disorder)   . Sleep apnea     Past Surgical History:  Procedure Laterality Date  . HEMORRHOID SURGERY    . LUMBAR LAMINECTOMY/DECOMPRESSION MICRODISCECTOMY Left 11/11/2012   Procedure: LUMBAR LAMINECTOMY/DECOMPRESSION MICRODISCECTOMY 1 LEVEL;  Surgeon: Temple Pacini, MD;  Location: [facility];  Service: Neurosurgery;  Laterality: Left;  left five sacral one  . SPINAL FUSION    . TONSILECTOMY, ADENOIDECTOMY, BILATERAL MYRINGOTOMY AND TUBES     tubes 16 times  . TONSILLECTOMY      There were no vitals filed for this visit.   Subjective Assessment - 12/06/20 1103    Subjective About the same pain as usual.    Patient Stated Goals More back flexibility    Currently in Pain? Yes    Pain Score 6     Pain Location Back    Pain Orientation Lower;Medial                             [facility] Adult PT Treatment/Exercise - 12/06/20 0001      Exercises   Exercises Lumbar      Lumbar Exercises: Standing   Other Standing Lumbar Exercises hip hinge with dowel   VC to keep knees flexed slightly     Lumbar Exercises: Seated    Sit to Stand 15 reps    Sit to Stand Limitations with hip hinge using dowel    Other Seated Lumbar Exercises hip hinge with dowel      Lumbar Exercises: Supine   Other Supine Lumbar Exercises resisted Rt hip flexion    Other Supine Lumbar Exercises hooklying ball squeeze      Manual Therapy   Manual Therapy Soft tissue mobilization    Soft tissue mobilization Lt QL and paraspinals                  PT Education - 12/06/20 1321    Education Details hip hinge v bending restriction    Person(s) Educated Patient    Methods Explanation    Comprehension Verbalized understanding;Need further instruction            PT Short Term Goals - 12/01/20 1717      PT SHORT TERM GOAL #1   Title Pt will be independent with initial stretching    Time 4    Period Weeks    Status New    Target Date 12/29/20      PT SHORT TERM  GOAL #2   Title Pt will report pain reduction by at least 25% with lumbar ROM    Baseline 6/10 (12/01/20)    Time 4    Period Weeks    Status New    Target Date 12/29/20      PT SHORT TERM GOAL #3   Title Pt will be able to increase lumbar flexion to at least 20% for improved reach    Baseline Currently only has 10% lumbar flexion (12/01/20)    Time 4    Period Weeks    Status New    Target Date 12/29/20             PT Long Term Goals - 12/02/20 0937      PT LONG TERM GOAL #1   Title Pt will be independent with final HEP    Time 8    Period Weeks    Status New    Target Date 01/27/21      PT LONG TERM GOAL #2   Title Pt will be able to perform his own ADLs below his knee with or without reacher/grabber/long handled sponge    Baseline Wife currently assists (12/01/20)    Time 8    Period Weeks    Status New    Target Date 01/27/21      PT LONG TERM GOAL #3   Title Pt will be able to walk >122 steps without L LE numbness for improved home/limited community amb    Time 8    Period Weeks    Status New    Target Date 01/27/21                  Plan - 12/06/20 1141    Clinical Impression Statement Notable post Rt inom rotation and added isometric flexion to HEP for gradual correction. Focus was on utilizing hip hinge for functional bending today which he tolerated well. Advised that this should not cause back pain. Still pushes straight up from hands in sit to stand when not reminded but will practice. Discussed aquatics and will do this in future apts.    PT Treatment/Interventions ADLs/Self Care Home Management;Aquatic Therapy;Cryotherapy;Electrical Stimulation;Iontophoresis 4mg /ml Dexamethasone;Moist Heat;Ultrasound;DME Instruction;Gait training;Stair training;Functional mobility training;Therapeutic activities;Therapeutic exercise;Balance training;Neuromuscular re-education;Patient/family education;Manual techniques;Passive range of motion;Dry needling;Taping;Vasopneumatic Device    PT Next Visit Plan recheck rotation & lumbopelvic stability, progress hip hinge in standing if appropriate    PT Home Exercise Plan [id number]    Consulted and Agree with Plan of Care Patient           Patient will benefit from skilled therapeutic intervention in order to improve the following deficits and impairments:  Abnormal gait,Hypomobility,Decreased activity tolerance,Decreased strength,Increased fascial restricitons  Visit Diagnosis: Chronic midline low back pain with left-sided sciatica  Muscle weakness (generalized)  Difficulty in walking, not elsewhere classified     Problem List Patient Active Problem List   Diagnosis Date Noted  . OSA (obstructive sleep apnea) 03/04/2014  . Insomnia 03/04/2014  . 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 PTSD (post-traumatic stress disorder) 03/04/2014   Han Vejar C. Courtnee Myer PT, DPT 12/06/20 1:21 PM   [facility] [address] [place], [place], [place] Phone: [phone]   Fax:  [phone]  Name: Johnny Clarke MRN: [name] Date of Birth: 09-Nov-1975

## 2020-12-14 ENCOUNTER — Ambulatory Visit (HOSPITAL_BASED_OUTPATIENT_CLINIC_OR_DEPARTMENT_OTHER): Payer: No Typology Code available for payment source | Admitting: Physical Therapy

## 2020-12-15 ENCOUNTER — Ambulatory Visit (HOSPITAL_BASED_OUTPATIENT_CLINIC_OR_DEPARTMENT_OTHER): Payer: No Typology Code available for payment source | Admitting: Physical Therapy

## 2020-12-15 ENCOUNTER — Other Ambulatory Visit: Payer: Self-pay

## 2020-12-15 DIAGNOSIS — M6281 Muscle weakness (generalized): Secondary | ICD-10-CM

## 2020-12-15 DIAGNOSIS — G8929 Other chronic pain: Secondary | ICD-10-CM

## 2020-12-15 DIAGNOSIS — R262 Difficulty in walking, not elsewhere classified: Secondary | ICD-10-CM

## 2020-12-15 DIAGNOSIS — M5442 Lumbago with sciatica, left side: Secondary | ICD-10-CM

## 2020-12-15 NOTE — Therapy (Signed)
[facility] [address] [place], [place], [address] Phone: [phone]   Fax:  [phone]  Physical Therapy Treatment  Patient Details  Name: Johnny Clarke MRN: [id number] Date of Birth: 1975-09-14 Referring Provider (PT): Wynn Banker Victorino Sparrow, MD   Encounter Date: 12/15/2020   PT End of Session - 12/15/20 1515    Visit Number 3    Number of Visits 17    Date for PT Re-Evaluation 01/26/21    Authorization Type [place]    PT Start Time 1430    PT Stop Time 1513    PT Time Calculation (min) 43 min    Activity Tolerance Patient tolerated treatment well    Behavior During Therapy [facility] for tasks assessed/performed           Past Medical History:  Diagnosis Date  . Chronic back pain   . High cholesterol   . Mental disorder   . Migraine    Takes topamax  . PTSD (post-traumatic stress disorder)   . Sleep apnea     Past Surgical History:  Procedure Laterality Date  . HEMORRHOID SURGERY    . LUMBAR LAMINECTOMY/DECOMPRESSION MICRODISCECTOMY Left 11/11/2012   Procedure: LUMBAR LAMINECTOMY/DECOMPRESSION MICRODISCECTOMY 1 LEVEL;  Surgeon: Temple Pacini, MD;  Location: [facility];  Service: Neurosurgery;  Laterality: Left;  left five sacral one  . SPINAL FUSION    . TONSILECTOMY, ADENOIDECTOMY, BILATERAL MYRINGOTOMY AND TUBES     tubes 16 times  . TONSILLECTOMY      There were no vitals filed for this visit.   Subjective Assessment - 12/15/20 1426    Subjective Nothing new or different. Pt reports exercises have been pretty good.    Pertinent History Chronic back pain, hyperlipidemia, PTSD, sleep apnea. L5-S1 lumbar hemilaminotomy 2014; TLIF left L5-S1 Dr Charna Archer 04/07/2020    Limitations Standing;Walking    How long can you sit comfortably? Constant pain    How long can you stand comfortably? Unable to stand for too long    How long can you walk comfortably? 122 steps and L LE gets numb; uses wheelchair for primary mobility     Patient Stated Goals More back flexibility    Currently in Pain? Yes    Pain Score 6     Pain Location Back                             [facility] Adult PT Treatment/Exercise - 12/15/20 0001      Lumbar Exercises: Stretches   Other Lumbar Stretch Exercise pball forward x10, pball forward + right 3x20 sec      Lumbar Exercises: Seated   Sit to Stand 15 reps    Sit to Stand Limitations with hip hinge using dowel    Other Seated Lumbar Exercises On bosu: pelvic tilt x10, pelvic hiking x10, pelvic rotation x10 CW & CCW, LAQ x10 bilat      Lumbar Exercises: Supine   Ab Set 10 reps;5 seconds    AB Set Limitations red pball    Pelvic Tilt 10 reps    Clam 10 reps;5 seconds    Clam Limitations iso against belt    Bent Knee Raise 10 reps    Other Supine Lumbar Exercises hooklying ball squeeze with PPT x10                    PT Short Term Goals - 12/01/20 1717  PT SHORT TERM GOAL #1   Title Pt will be independent with initial stretching    Time 4    Period Weeks    Status New    Target Date 12/29/20      PT SHORT TERM GOAL #2   Title Pt will report pain reduction by at least 25% with lumbar ROM    Baseline 6/10 (12/01/20)    Time 4    Period Weeks    Status New    Target Date 12/29/20      PT SHORT TERM GOAL #3   Title Pt will be able to increase lumbar flexion to at least 20% for improved reach    Baseline Currently only has 10% lumbar flexion (12/01/20)    Time 4    Period Weeks    Status New    Target Date 12/29/20             PT Long Term Goals - 12/02/20 0937      PT LONG TERM GOAL #1   Title Pt will be independent with final HEP    Time 8    Period Weeks    Status New    Target Date 01/27/21      PT LONG TERM GOAL #2   Title Pt will be able to perform his own ADLs below his knee with or without reacher/grabber/long handled sponge    Baseline Wife currently assists (12/01/20)    Time 8    Period Weeks    Status New     Target Date 01/27/21      PT LONG TERM GOAL #3   Title Pt will be able to walk >122 steps without L LE numbness for improved home/limited community amb    Time 8    Period Weeks    Status New    Target Date 01/27/21                 Plan - 12/15/20 1452    Clinical Impression Statement Pt with less inominate rotation this session. Focused on core stabilization and pelvic mobility. Pt with difficulty performing hip hinging without increased back pain.    Personal Factors and Comorbidities Age;Time since onset of injury/illness/exacerbation;Comorbidity 1    Comorbidities Chronic back pain, hyperlipidemia, PTSD, sleep apnea. L5-S1 lumbar hemilaminotomy 2014; TLIF left L5-S1 Dr Charna Archer 04/07/2020    Examination-Activity Limitations Bathing;Sit;Stand;Hygiene/Grooming;Locomotion Level;Caring for Others;Bend;Transfers;Stairs;Squat    Examination-Participation Restrictions Cleaning;Community Activity;Interpersonal Relationship;Yard Work    Conservation officer, historic buildings Evolving/Moderate complexity    Rehab Potential Good    PT Frequency 2x / week    PT Duration 8 weeks    PT Treatment/Interventions ADLs/Self Care Home Management;Aquatic Therapy;Cryotherapy;Electrical Stimulation;Iontophoresis 4mg /ml Dexamethasone;Moist Heat;Ultrasound;DME Instruction;Gait training;Stair training;Functional mobility training;Therapeutic activities;Therapeutic exercise;Balance training;Neuromuscular re-education;Patient/family education;Manual techniques;Passive range of motion;Dry needling;Taping;Vasopneumatic Device    PT Next Visit Plan progress hip hinge in standing if appropriate, continue core and hip strengthening    PT Home Exercise Plan [id number]    Consulted and Agree with Plan of Care Patient           Patient will benefit from skilled therapeutic intervention in order to improve the following deficits and impairments:  Abnormal gait,Hypomobility,Decreased activity tolerance,Decreased  strength,Increased fascial restricitons  Visit Diagnosis: Chronic midline low back pain with left-sided sciatica  Muscle weakness (generalized)  Difficulty in walking, not elsewhere classified     Problem List Patient Active Problem List   Diagnosis Date Noted  . OSA (obstructive sleep apnea) 03/04/2014  .  Insomnia 03/04/2014  . 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 PTSD (post-traumatic stress disorder) 03/04/2014    [facility] [address] Friendly PT, DPT 12/15/2020, 3:15 PM  [facility] [address] [place], [place], [address] Phone: [phone]   Fax:  [phone]  Name: Johnny Clarke MRN: [id number] Date of Birth: December 06, 1975

## 2020-12-16 ENCOUNTER — Ambulatory Visit (HOSPITAL_BASED_OUTPATIENT_CLINIC_OR_DEPARTMENT_OTHER): Payer: No Typology Code available for payment source | Admitting: Physical Therapy

## 2020-12-16 DIAGNOSIS — M5442 Lumbago with sciatica, left side: Secondary | ICD-10-CM | POA: Diagnosis not present

## 2020-12-16 DIAGNOSIS — M6281 Muscle weakness (generalized): Secondary | ICD-10-CM

## 2020-12-16 DIAGNOSIS — R262 Difficulty in walking, not elsewhere classified: Secondary | ICD-10-CM

## 2020-12-16 DIAGNOSIS — G8929 Other chronic pain: Secondary | ICD-10-CM

## 2020-12-16 NOTE — Therapy (Signed)
[facility] [address] [place], [place], [address] Phone: [phone]   Fax:  [phone]  Physical Therapy Treatment  Patient Details  Name: Johnny Clarke MRN: [id number] Date of Birth: 1975/09/14 Referring Provider (PT): Wynn Banker Victorino Sparrow, MD   Encounter Date: 12/16/2020   PT End of Session - 12/16/20 0917    Visit Number 4    Number of Visits 17    Date for PT Re-Evaluation 01/26/21    Authorization Type [place]    PT Start Time 0920    PT Stop Time 1005    PT Time Calculation (min) 45 min    Activity Tolerance Patient tolerated treatment well    Behavior During Therapy [facility] for tasks assessed/performed           Past Medical History:  Diagnosis Date  . Chronic back pain   . High cholesterol   . Mental disorder   . Migraine    Takes topamax  . PTSD (post-traumatic stress disorder)   . Sleep apnea     Past Surgical History:  Procedure Laterality Date  . HEMORRHOID SURGERY    . LUMBAR LAMINECTOMY/DECOMPRESSION MICRODISCECTOMY Left 11/11/2012   Procedure: LUMBAR LAMINECTOMY/DECOMPRESSION MICRODISCECTOMY 1 LEVEL;  Surgeon: Temple Pacini, MD;  Location: [facility];  Service: Neurosurgery;  Laterality: Left;  left five sacral one  . SPINAL FUSION    . TONSILECTOMY, ADENOIDECTOMY, BILATERAL MYRINGOTOMY AND TUBES     tubes 16 times  . TONSILLECTOMY      There were no vitals filed for this visit.   Subjective Assessment - 12/16/20 0922    Subjective Pt states he wasn't too sore yesterday after exercises.    Pertinent History Chronic back pain, hyperlipidemia, PTSD, sleep apnea. L5-S1 lumbar hemilaminotomy 2014; TLIF left L5-S1 Dr Charna Archer 04/07/2020    Limitations Standing;Walking    How long can you sit comfortably? Constant pain    How long can you stand comfortably? Unable to stand for too long    How long can you walk comfortably? 122 steps and L LE gets numb; uses wheelchair for primary mobility    Patient  Stated Goals More back flexibility    Currently in Pain? Yes    Pain Score 6     Pain Location Back    Pain Orientation Lower;Medial    Pain Type Chronic pain                             [facility] Adult PT Treatment/Exercise - 12/16/20 0001      Lumbar Exercises: Stretches   Other Lumbar Stretch Exercise pball forward x10, pball forward + right 10x5 sec    Other Lumbar Stretch Exercise Sidelying open/close book 10x5 sec; L stretch 5x10 sec; seated cat/cow 5x5 sec; child's pose 3x10 sec      Lumbar Exercises: Seated   Sit to Stand Limitations with hip hinge x10    Other Seated Lumbar Exercises On bosu: pelvic tilt x10, pelvic hiking x10, pelvic rotation x10 CW & CCW, marching x10 bilat    Other Seated Lumbar Exercises --      Lumbar Exercises: Quadruped   Other Quadruped Lumbar Exercises Cat/cow x10    Other Quadruped Lumbar Exercises Rocking forward/backward x5      Manual Therapy   Manual Therapy Soft tissue mobilization;Joint mobilization    Joint Mobilization PA upper lumbar and thoracic grade 3/4 mobs; sidelying gapping grade 3 mobs upper lumbar &  thoracic    Soft tissue mobilization Lt QL and paraspinals                    PT Short Term Goals - 12/01/20 1717      PT SHORT TERM GOAL #1   Title Pt will be independent with initial stretching    Time 4    Period Weeks    Status New    Target Date 12/29/20      PT SHORT TERM GOAL #2   Title Pt will report pain reduction by at least 25% with lumbar ROM    Baseline 6/10 (12/01/20)    Time 4    Period Weeks    Status New    Target Date 12/29/20      PT SHORT TERM GOAL #3   Title Pt will be able to increase lumbar flexion to at least 20% for improved reach    Baseline Currently only has 10% lumbar flexion (12/01/20)    Time 4    Period Weeks    Status New    Target Date 12/29/20             PT Long Term Goals - 12/02/20 0937      PT LONG TERM GOAL #1   Title Pt will be independent with  final HEP    Time 8    Period Weeks    Status New    Target Date 01/27/21      PT LONG TERM GOAL #2   Title Pt will be able to perform his own ADLs below his knee with or without reacher/grabber/long handled sponge    Baseline Wife currently assists (12/01/20)    Time 8    Period Weeks    Status New    Target Date 01/27/21      PT LONG TERM GOAL #3   Title Pt will be able to walk >122 steps without L LE numbness for improved home/limited community amb    Time 8    Period Weeks    Status New    Target Date 01/27/21                 Plan - 12/16/20 1014    Clinical Impression Statement Pt took pain medication prior to PT treatment. Session focused on continued pelvic mobility and spine mobility. Manual therapy performed accordingly. Pt's spine is grossly hypomobile with very little lumbar lordosis and thoracic kyphosis. Updated HEP with stretches for general spinal mobility.    Personal Factors and Comorbidities Age;Time since onset of injury/illness/exacerbation;Comorbidity 1    Comorbidities Chronic back pain, hyperlipidemia, PTSD, sleep apnea. L5-S1 lumbar hemilaminotomy 2014; TLIF left L5-S1 Dr Charna Archer 04/07/2020    Examination-Activity Limitations Bathing;Sit;Stand;Hygiene/Grooming;Locomotion Level;Caring for Others;Bend;Transfers;Stairs;Squat    Examination-Participation Restrictions Cleaning;Community Activity;Interpersonal Relationship;Yard Work    Conservation officer, historic buildings Evolving/Moderate complexity    Rehab Potential Good    PT Frequency 2x / week    PT Duration 8 weeks    PT Treatment/Interventions ADLs/Self Care Home Management;Aquatic Therapy;Cryotherapy;Electrical Stimulation;Iontophoresis 4mg /ml Dexamethasone;Moist Heat;Ultrasound;DME Instruction;Gait training;Stair training;Functional mobility training;Therapeutic activities;Therapeutic exercise;Balance training;Neuromuscular re-education;Patient/family education;Manual techniques;Passive range of  motion;Dry needling;Taping;Vasopneumatic Device    PT Next Visit Plan progress hip hinge in standing if appropriate, continue core and hip strengthening    PT Home Exercise Plan [id number]    Consulted and Agree with Plan of Care Patient           Patient will benefit from skilled therapeutic intervention in order to  improve the following deficits and impairments:  Abnormal gait,Hypomobility,Decreased activity tolerance,Decreased strength,Increased fascial restricitons  Visit Diagnosis: Chronic midline low back pain with left-sided sciatica  Muscle weakness (generalized)  Difficulty in walking, not elsewhere classified     Problem List Patient Active Problem List   Diagnosis Date Noted  . OSA (obstructive sleep apnea) 03/04/2014  . Insomnia 03/04/2014  . 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 PTSD (post-traumatic stress disorder) 03/04/2014    [facility] April Ma L Dekota Shenk PT, DPT 12/16/2020, 10:16 AM  [facility] [address] [place], [place], [address] Phone: [phone]   Fax:  [phone]  Name: Johnny Clarke MRN: [id number] Date of Birth: [birth year]

## 2020-12-22 ENCOUNTER — Other Ambulatory Visit: Payer: Self-pay

## 2020-12-22 ENCOUNTER — Ambulatory Visit (HOSPITAL_BASED_OUTPATIENT_CLINIC_OR_DEPARTMENT_OTHER): Payer: No Typology Code available for payment source | Admitting: Physical Therapy

## 2020-12-22 DIAGNOSIS — M6281 Muscle weakness (generalized): Secondary | ICD-10-CM

## 2020-12-22 DIAGNOSIS — M5442 Lumbago with sciatica, left side: Secondary | ICD-10-CM

## 2020-12-22 DIAGNOSIS — G8929 Other chronic pain: Secondary | ICD-10-CM

## 2020-12-22 DIAGNOSIS — R262 Difficulty in walking, not elsewhere classified: Secondary | ICD-10-CM

## 2020-12-22 NOTE — Therapy (Signed)
[facility] [address] [place], [place], [address] Phone: [phone]   Fax:  [phone]  Physical Therapy Treatment  Patient Details  Name: Johnny Clarke MRN: [id number] Date of Birth: 19-Aug-1976 Referring Provider (PT): Wynn Banker Victorino Sparrow, MD   Encounter Date: 12/22/2020   PT End of Session - 12/22/20 1155    Visit Number 5    Number of Visits 17    Date for PT Re-Evaluation 01/26/21    Authorization Type [place]    PT Start Time 1105    PT Stop Time 1145    PT Time Calculation (min) 40 min    Activity Tolerance Patient tolerated treatment well    Behavior During Therapy [facility] for tasks assessed/performed           Past Medical History:  Diagnosis Date  . Chronic back pain   . High cholesterol   . Mental disorder   . Migraine    Takes topamax  . PTSD (post-traumatic stress disorder)   . Sleep apnea     Past Surgical History:  Procedure Laterality Date  . HEMORRHOID SURGERY    . LUMBAR LAMINECTOMY/DECOMPRESSION MICRODISCECTOMY Left 11/11/2012   Procedure: LUMBAR LAMINECTOMY/DECOMPRESSION MICRODISCECTOMY 1 LEVEL;  Surgeon: Temple Pacini, MD;  Location: [facility];  Service: Neurosurgery;  Laterality: Left;  left five sacral one  . SPINAL FUSION    . TONSILECTOMY, ADENOIDECTOMY, BILATERAL MYRINGOTOMY AND TUBES     tubes 16 times  . TONSILLECTOMY      There were no vitals filed for this visit.   Subjective Assessment - 12/22/20 1107    Subjective Pt states he has been able to do exercises daily. Pt reports he's not doing too bad. Reports he took his pain medication this morning.    Pertinent History Chronic back pain, hyperlipidemia, PTSD, sleep apnea. L5-S1 lumbar hemilaminotomy 2014; TLIF left L5-S1 Dr Charna Archer 04/07/2020    Limitations Standing;Walking    How long can you sit comfortably? Constant pain    How long can you stand comfortably? Unable to stand for too long    How long can you walk comfortably?  122 steps and L LE gets numb; uses wheelchair for primary mobility    Patient Stated Goals More back flexibility    Currently in Pain? Yes    Pain Score 7     Pain Location Back                             [facility] Adult PT Treatment/Exercise - 12/22/20 0001      Lumbar Exercises: Stretches   Piriformis Stretch Right;Left;30 seconds    Figure 4 Stretch 30 seconds;Supine    Other Lumbar Stretch Exercise "L" stretch x30 sec & then with side flexion x30 sec each      Lumbar Exercises: Standing   Other Standing Lumbar Exercises modified hip hinge with trunk & hip extension 2x10   Reports increased nerve pain after exercise   Other Standing Lumbar Exercises Modified plank 2x30 sec      Lumbar Exercises: Seated   Sit to Stand Limitations with hip hinge x10    Other Seated Lumbar Exercises On pball: pelvic tilt 2x10, pelvic hiking 2x10, pelvic rotation x10 CW & CCW, LAQ x10 bilat, alternating arm raises x10      Lumbar Exercises: Supine   Bridge 10 reps;Compliant      Manual Therapy   Joint Mobilization PA upper lumbar and  thoracic grade 3/4 mobs; sidelying gapping grade 3 mobs upper lumbar & thoracic                    PT Short Term Goals - 12/01/20 1717      PT SHORT TERM GOAL #1   Title Pt will be independent with initial stretching    Time 4    Period Weeks    Status New    Target Date 12/29/20      PT SHORT TERM GOAL #2   Title Pt will report pain reduction by at least 25% with lumbar ROM    Baseline 6/10 (12/01/20)    Time 4    Period Weeks    Status New    Target Date 12/29/20      PT SHORT TERM GOAL #3   Title Pt will be able to increase lumbar flexion to at least 20% for improved reach    Baseline Currently only has 10% lumbar flexion (12/01/20)    Time 4    Period Weeks    Status New    Target Date 12/29/20             PT Long Term Goals - 12/02/20 0937      PT LONG TERM GOAL #1   Title Pt will be independent with final HEP     Time 8    Period Weeks    Status New    Target Date 01/27/21      PT LONG TERM GOAL #2   Title Pt will be able to perform his own ADLs below his knee with or without reacher/grabber/long handled sponge    Baseline Wife currently assists (12/01/20)    Time 8    Period Weeks    Status New    Target Date 01/27/21      PT LONG TERM GOAL #3   Title Pt will be able to walk >122 steps without L LE numbness for improved home/limited community amb    Time 8    Period Weeks    Status New    Target Date 01/27/21                 Plan - 12/22/20 1153    Clinical Impression Statement Continued to work on pelvic mobility, core and trunk strengthening/stabilization. Pt demos some improvement with spine mobility. Progressed pt's exercises to include bridging for glute activation. Attempted hip hinging in standing; however, increased pt's nerve pain.    Personal Factors and Comorbidities Age;Time since onset of injury/illness/exacerbation;Comorbidity 1    Comorbidities Chronic back pain, hyperlipidemia, PTSD, sleep apnea. L5-S1 lumbar hemilaminotomy 2014; TLIF left L5-S1 Dr Charna Archer 04/07/2020    Examination-Activity Limitations Bathing;Sit;Stand;Hygiene/Grooming;Locomotion Level;Caring for Others;Bend;Transfers;Stairs;Squat    Examination-Participation Restrictions Cleaning;Community Activity;Interpersonal Relationship;Yard Work    Conservation officer, historic buildings Evolving/Moderate complexity    Rehab Potential Good    PT Frequency 2x / week    PT Duration 8 weeks    PT Treatment/Interventions ADLs/Self Care Home Management;Aquatic Therapy;Cryotherapy;Electrical Stimulation;Iontophoresis 4mg /ml Dexamethasone;Moist Heat;Ultrasound;DME Instruction;Gait training;Stair training;Functional mobility training;Therapeutic activities;Therapeutic exercise;Balance training;Neuromuscular re-education;Patient/family education;Manual techniques;Passive range of motion;Dry needling;Taping;Vasopneumatic Device     PT Next Visit Plan progress hip hinge in standing if appropriate, continue core and hip strengthening. Check pelvic rotation and address as needed.    PT Home Exercise Plan [id number]    Consulted and Agree with Plan of Care Patient           Patient will benefit from skilled therapeutic intervention  in order to improve the following deficits and impairments:  Abnormal gait,Hypomobility,Decreased activity tolerance,Decreased strength,Increased fascial restricitons  Visit Diagnosis: Chronic midline low back pain with left-sided sciatica  Muscle weakness (generalized)  Difficulty in walking, not elsewhere classified     Problem List Patient Active Problem List   Diagnosis Date Noted  . OSA (obstructive sleep apnea) 03/04/2014  . Insomnia 03/04/2014  . 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 PTSD (post-traumatic stress disorder) 03/04/2014    [facility] April Dell Ponto PT, DPT 12/22/2020, 12:41 PM  [facility] [address] [place], [place], [address] Phone: [phone]   Fax:  [phone]  Name: Glenford Garis MRN: [id number] Date of Birth: 03-27-1976

## 2020-12-24 ENCOUNTER — Ambulatory Visit (HOSPITAL_BASED_OUTPATIENT_CLINIC_OR_DEPARTMENT_OTHER): Payer: No Typology Code available for payment source | Admitting: Physical Therapy

## 2020-12-24 ENCOUNTER — Encounter (HOSPITAL_BASED_OUTPATIENT_CLINIC_OR_DEPARTMENT_OTHER): Payer: Self-pay | Admitting: Physical Therapy

## 2020-12-24 ENCOUNTER — Other Ambulatory Visit: Payer: Self-pay

## 2020-12-24 DIAGNOSIS — M5442 Lumbago with sciatica, left side: Secondary | ICD-10-CM | POA: Diagnosis not present

## 2020-12-24 DIAGNOSIS — M6281 Muscle weakness (generalized): Secondary | ICD-10-CM

## 2020-12-24 DIAGNOSIS — R262 Difficulty in walking, not elsewhere classified: Secondary | ICD-10-CM

## 2020-12-24 DIAGNOSIS — G8929 Other chronic pain: Secondary | ICD-10-CM

## 2020-12-24 NOTE — Therapy (Signed)
[facility] [address] [place], [place], [address] Phone: [phone]   Fax:  [phone]  Physical Therapy Treatment  Patient Details  Name: Johnny Clarke MRN: [id number] Date of Birth: 1976-05-14 Referring Provider (PT): Wynn Banker Victorino Sparrow, MD   Encounter Date: 12/24/2020   PT End of Session - 12/24/20 1107    Visit Number 6    Number of Visits 17    Date for PT Re-Evaluation 01/26/21    Authorization Type [place]    PT Start Time 1107    PT Stop Time 1145    PT Time Calculation (min) 38 min    Activity Tolerance Patient tolerated treatment well    Behavior During Therapy [facility] for tasks assessed/performed           Past Medical History:  Diagnosis Date  . Chronic back pain   . High cholesterol   . Mental disorder   . Migraine    Takes topamax  . PTSD (post-traumatic stress disorder)   . Sleep apnea     Past Surgical History:  Procedure Laterality Date  . HEMORRHOID SURGERY    . LUMBAR LAMINECTOMY/DECOMPRESSION MICRODISCECTOMY Left 11/11/2012   Procedure: LUMBAR LAMINECTOMY/DECOMPRESSION MICRODISCECTOMY 1 LEVEL;  Surgeon: Temple Pacini, MD;  Location: [facility];  Service: Neurosurgery;  Laterality: Left;  left five sacral one  . SPINAL FUSION    . TONSILECTOMY, ADENOIDECTOMY, BILATERAL MYRINGOTOMY AND TUBES     tubes 16 times  . TONSILLECTOMY      There were no vitals filed for this visit.   Subjective Assessment - 12/24/20 1108    Subjective Pt reports nothing new or different. Pt reports no issues with exercises.    Pertinent History Chronic back pain, hyperlipidemia, PTSD, sleep apnea. L5-S1 lumbar hemilaminotomy 2014; TLIF left L5-S1 Dr Charna Archer 04/07/2020    Limitations Standing;Walking    How long can you sit comfortably? Constant pain    How long can you stand comfortably? Unable to stand for too long    How long can you walk comfortably? 122 steps and L LE gets numb; uses wheelchair for primary  mobility    Patient Stated Goals More back flexibility    Currently in Pain? Yes    Pain Score 6     Pain Location Back    Pain Orientation Lower;Medial    Pain Descriptors / Indicators Throbbing;Radiating;Constant    Pain Type Chronic pain    Pain Radiating Towards L leg                             [facility] Adult PT Treatment/Exercise - 12/24/20 0001      Lumbar Exercises: Stretches   Single Knee to Chest Stretch 20 seconds;Left    Lower Trunk Rotation 20 seconds    Piriformis Stretch Left;30 seconds      Lumbar Exercises: Aerobic   Nustep L4 x 3 min   Unable to tolerate without increased pain and symptoms     Lumbar Exercises: Supine   Clam 20 reps    Clam Limitations green tband with PPT    Bridge 20 reps;Compliant    Other Supine Lumbar Exercises sciatic nerve 2x10    Other Supine Lumbar Exercises hooklying ball squeeze with PPT 5x20 sec                    PT Short Term Goals - 12/01/20 1717      PT  SHORT TERM GOAL #1   Title Pt will be independent with initial stretching    Time 4    Period Weeks    Status New    Target Date 12/29/20      PT SHORT TERM GOAL #2   Title Pt will report pain reduction by at least 25% with lumbar ROM    Baseline 6/10 (12/01/20)    Time 4    Period Weeks    Status New    Target Date 12/29/20      PT SHORT TERM GOAL #3   Title Pt will be able to increase lumbar flexion to at least 20% for improved reach    Baseline Currently only has 10% lumbar flexion (12/01/20)    Time 4    Period Weeks    Status New    Target Date 12/29/20             PT Long Term Goals - 12/02/20 0937      PT LONG TERM GOAL #1   Title Pt will be independent with final HEP    Time 8    Period Weeks    Status New    Target Date 01/27/21      PT LONG TERM GOAL #2   Title Pt will be able to perform his own ADLs below his knee with or without reacher/grabber/long handled sponge    Baseline Wife currently assists (12/01/20)     Time 8    Period Weeks    Status New    Target Date 01/27/21      PT LONG TERM GOAL #3   Title Pt will be able to walk >122 steps without L LE numbness for improved home/limited community amb    Time 8    Period Weeks    Status New    Target Date 01/27/21                 Plan - 12/24/20 1114    Clinical Impression Statement Attempted Nustep for warm-up; however, pt unable to tolerate without increased symptoms and pain. Pt found to have R inonimate rotation -- addressed with METs. Progressed HEP as able. Continues to find supine march difficult with PPT.    Personal Factors and Comorbidities Age;Time since onset of injury/illness/exacerbation;Comorbidity 1    Comorbidities Chronic back pain, hyperlipidemia, PTSD, sleep apnea. L5-S1 lumbar hemilaminotomy 2014; TLIF left L5-S1 Dr Charna Archer 04/07/2020    Examination-Activity Limitations Bathing;Sit;Stand;Hygiene/Grooming;Locomotion Level;Caring for Others;Bend;Transfers;Stairs;Squat    Examination-Participation Restrictions Cleaning;Community Activity;Interpersonal Relationship;Yard Work    Conservation officer, historic buildings Evolving/Moderate complexity    Rehab Potential Good    PT Frequency 2x / week    PT Duration 8 weeks    PT Treatment/Interventions ADLs/Self Care Home Management;Aquatic Therapy;Cryotherapy;Electrical Stimulation;Iontophoresis 4mg /ml Dexamethasone;Moist Heat;Ultrasound;DME Instruction;Gait training;Stair training;Functional mobility training;Therapeutic activities;Therapeutic exercise;Balance training;Neuromuscular re-education;Patient/family education;Manual techniques;Passive range of motion;Dry needling;Taping;Vasopneumatic Device    PT Next Visit Plan progress hip hinge in standing if appropriate, continue core and hip strengthening. Check pelvic rotation and address as needed.    PT Home Exercise Plan [id number]    Consulted and Agree with Plan of Care Patient           Patient will benefit from skilled  therapeutic intervention in order to improve the following deficits and impairments:  Abnormal gait,Hypomobility,Decreased activity tolerance,Decreased strength,Increased fascial restricitons  Visit Diagnosis: Chronic midline low back pain with left-sided sciatica  Muscle weakness (generalized)  Difficulty in walking, not elsewhere classified     Problem  List Patient Active Problem List   Diagnosis Date Noted  . OSA (obstructive sleep apnea) 03/04/2014  . Insomnia 03/04/2014  . 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 PTSD (post-traumatic stress disorder) 03/04/2014    [facility] April Dell Ponto PT, DPT 12/24/2020, 11:53 AM  [facility] [address] [place], [place], [address] Phone: [phone]   Fax:  [phone]  Name: Kyro Joswick MRN: [id number] Date of Birth: 1976-01-12

## 2020-12-27 ENCOUNTER — Other Ambulatory Visit: Payer: Self-pay

## 2020-12-27 ENCOUNTER — Ambulatory Visit (HOSPITAL_BASED_OUTPATIENT_CLINIC_OR_DEPARTMENT_OTHER): Payer: No Typology Code available for payment source | Admitting: Physical Therapy

## 2020-12-27 DIAGNOSIS — R262 Difficulty in walking, not elsewhere classified: Secondary | ICD-10-CM

## 2020-12-27 DIAGNOSIS — G8929 Other chronic pain: Secondary | ICD-10-CM

## 2020-12-27 DIAGNOSIS — M5442 Lumbago with sciatica, left side: Secondary | ICD-10-CM

## 2020-12-27 DIAGNOSIS — M6281 Muscle weakness (generalized): Secondary | ICD-10-CM

## 2020-12-27 NOTE — Therapy (Signed)
[facility] [address] [place], [place], [address] Phone: [phone]   Fax:  [phone]  Physical Therapy Treatment  Patient Details  Name: Johnny Clarke MRN: [id number] Date of Birth: 1975-10-21 Referring Provider (PT): Wynn Banker Victorino Sparrow, MD   Encounter Date: 12/27/2020   PT End of Session - 12/27/20 1059    Visit Number 7    Number of Visits 17    Date for PT Re-Evaluation 01/26/21    Authorization Type [place]    PT Start Time 1100    PT Stop Time 1145    PT Time Calculation (min) 45 min    Activity Tolerance Patient tolerated treatment well    Behavior During Therapy [facility] for tasks assessed/performed           Past Medical History:  Diagnosis Date  . Chronic back pain   . High cholesterol   . Mental disorder   . Migraine    Takes topamax  . PTSD (post-traumatic stress disorder)   . Sleep apnea     Past Surgical History:  Procedure Laterality Date  . HEMORRHOID SURGERY    . LUMBAR LAMINECTOMY/DECOMPRESSION MICRODISCECTOMY Left 11/11/2012   Procedure: LUMBAR LAMINECTOMY/DECOMPRESSION MICRODISCECTOMY 1 LEVEL;  Surgeon: Temple Pacini, MD;  Location: [facility];  Service: Neurosurgery;  Laterality: Left;  left five sacral one  . SPINAL FUSION    . TONSILECTOMY, ADENOIDECTOMY, BILATERAL MYRINGOTOMY AND TUBES     tubes 16 times  . TONSILLECTOMY      There were no vitals filed for this visit.   Subjective Assessment - 12/27/20 1102    Subjective Pt reports nothing new or different. Pt reports no issues with exercises.    Pertinent History Chronic back pain, hyperlipidemia, PTSD, sleep apnea. L5-S1 lumbar hemilaminotomy 2014; TLIF left L5-S1 Dr Charna Archer 04/07/2020    Limitations Standing;Walking    How long can you sit comfortably? Constant pain    How long can you stand comfortably? Unable to stand for too long    How long can you walk comfortably? 122 steps and L LE gets numb; uses wheelchair for primary  mobility    Patient Stated Goals More back flexibility    Currently in Pain? Yes    Pain Score 6     Pain Location Back              [facility] PT Assessment - 12/27/20 0001      AROM   Lumbar Flexion 30   measured in sitting with inclinometer   Lumbar - Right Side Bend 20    Lumbar - Left Side Bend 25                         [facility] Adult PT Treatment/Exercise - 12/27/20 0001      Lumbar Exercises: Stretches   Other Lumbar Stretch Exercise "L" stretch x30 sec & then with side flexion x30 sec each      Lumbar Exercises: Standing   Other Standing Lumbar Exercises PPT against wall x10, PEC against wall x10      Lumbar Exercises: Supine   Other Supine Lumbar Exercises PPT with feet 90/90 on pball x10, with knee flex/ext x10, with rotation back to center x10      Manual Therapy   Soft tissue mobilization Lt QL and lower thoracic & upper lumbar paraspinals; self massage with tennis ball            Trigger Point Dry  Needling - 12/27/20 0001    Consent Given? Yes    Education Handout Provided Yes    Muscles Treated Back/Hip Lumbar multifidi;Erector spinae    Dry Needling Comments Performed by Illene Labrador    Lumbar multifidi Response Twitch response elicited   L1, L3, L4                 PT Short Term Goals - 12/27/20 1129      PT SHORT TERM GOAL #1   Title Pt will be independent with initial stretching    Time 4    Period Weeks    Status Achieved    Target Date 12/29/20      PT SHORT TERM GOAL #2   Title Pt will report pain reduction by at least 25% with lumbar ROM    Baseline 6/10 (12/01/20); increases to 7/10 with movement 4/25    Time 4    Period Weeks    Status On-going    Target Date 12/29/20      PT SHORT TERM GOAL #3   Title Pt will be able to increase lumbar flexion to at least 20% for improved reach    Baseline Currently only has 10% lumbar flexion (12/01/20)    Time 4    Period Weeks    Status Achieved    Target Date 12/29/20              PT Long Term Goals - 12/02/20 0937      PT LONG TERM GOAL #1   Title Pt will be independent with final HEP    Time 8    Period Weeks    Status New    Target Date 01/27/21      PT LONG TERM GOAL #2   Title Pt will be able to perform his own ADLs below his knee with or without reacher/grabber/long handled sponge    Baseline Wife currently assists (12/01/20)    Time 8    Period Weeks    Status New    Target Date 01/27/21      PT LONG TERM GOAL #3   Title Pt will be able to walk >122 steps without L LE numbness for improved home/limited community amb    Time 8    Period Weeks    Status New    Target Date 01/27/21                 Plan - 12/27/20 1147    Clinical Impression Statement Provided TPDN and manual therapy to address pt's continued L paraspinal tightness. Progressed pt's strengthening exercises into standing position. Pt with some improvement in lumbar and thoracic mobility; however, continues to have increased pain with movement. Continued to work on core stabilization.    Personal Factors and Comorbidities Age;Time since onset of injury/illness/exacerbation;Comorbidity 1    Comorbidities Chronic back pain, hyperlipidemia, PTSD, sleep apnea. L5-S1 lumbar hemilaminotomy 2014; TLIF left L5-S1 Dr Charna Archer 04/07/2020    Examination-Activity Limitations Bathing;Sit;Stand;Hygiene/Grooming;Locomotion Level;Caring for Others;Bend;Transfers;Stairs;Squat    Examination-Participation Restrictions Cleaning;Community Activity;Interpersonal Relationship;Yard Work    PT Treatment/Interventions ADLs/Self Care Home Management;Aquatic Therapy;Cryotherapy;Electrical Stimulation;Iontophoresis 4mg /ml Dexamethasone;Moist Heat;Ultrasound;DME Instruction;Gait training;Stair training;Functional mobility training;Therapeutic activities;Therapeutic exercise;Balance training;Neuromuscular re-education;Patient/family education;Manual techniques;Passive range of motion;Dry  needling;Taping;Vasopneumatic Device    PT Next Visit Plan progress hip hinge in standing if appropriate, continue core and hip strengthening. Check pelvic rotation and address as needed.    PT Home Exercise Plan [id number]    Consulted and Agree with Plan of Care Patient  Patient will benefit from skilled therapeutic intervention in order to improve the following deficits and impairments:  Abnormal gait,Hypomobility,Decreased activity tolerance,Decreased strength,Increased fascial restricitons  Visit Diagnosis: Chronic midline low back pain with left-sided sciatica  Muscle weakness (generalized)  Difficulty in walking, not elsewhere classified     Problem List Patient Active Problem List   Diagnosis Date Noted  . OSA (obstructive sleep apnea) 03/04/2014  . Insomnia 03/04/2014  . 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 PTSD (post-traumatic stress disorder) 03/04/2014    [facility] April Dell Ponto PT, DPT 12/27/2020, 11:48 AM  [facility] [address] [place], [place], [address] Phone: [phone]   Fax:  [phone]  Name: Mckennon Zwart MRN: [id number] Date of Birth: [birth year]

## 2020-12-29 ENCOUNTER — Ambulatory Visit (HOSPITAL_BASED_OUTPATIENT_CLINIC_OR_DEPARTMENT_OTHER): Payer: No Typology Code available for payment source | Admitting: Physical Therapy

## 2020-12-29 ENCOUNTER — Encounter (HOSPITAL_BASED_OUTPATIENT_CLINIC_OR_DEPARTMENT_OTHER): Payer: Self-pay | Admitting: Physical Therapy

## 2020-12-29 ENCOUNTER — Other Ambulatory Visit: Payer: Self-pay

## 2020-12-29 DIAGNOSIS — G8929 Other chronic pain: Secondary | ICD-10-CM

## 2020-12-29 DIAGNOSIS — M6281 Muscle weakness (generalized): Secondary | ICD-10-CM

## 2020-12-29 DIAGNOSIS — M5442 Lumbago with sciatica, left side: Secondary | ICD-10-CM

## 2020-12-29 DIAGNOSIS — R262 Difficulty in walking, not elsewhere classified: Secondary | ICD-10-CM

## 2020-12-30 ENCOUNTER — Encounter: Payer: Self-pay | Admitting: Physical Medicine & Rehabilitation

## 2020-12-30 ENCOUNTER — Other Ambulatory Visit: Payer: Self-pay

## 2020-12-30 ENCOUNTER — Encounter
Payer: No Typology Code available for payment source | Attending: Physical Medicine & Rehabilitation | Admitting: Physical Medicine & Rehabilitation

## 2020-12-30 ENCOUNTER — Encounter: Payer: Self-pay | Admitting: *Deleted

## 2020-12-30 VITALS — BP 121/79 | HR 83 | Temp 98.7°F | Ht 70.0 in | Wt 223.0 lb

## 2020-12-30 DIAGNOSIS — M961 Postlaminectomy syndrome, not elsewhere classified: Secondary | ICD-10-CM | POA: Diagnosis not present

## 2020-12-30 NOTE — Progress Notes (Signed)
Subjective:     Patient ID: Johnny Clarke, male   DOB: 1975-10-01, 45 y.o.   MRN: [id number]  HPI Patient returns today stating that the left L4-5 transforaminal was helpful for several days until he started physical therapy.  He is still getting physical therapy and is scheduled some additional appointments.  He has had 7 out of 15 authorized treatments thus far.  He feels like his core strength has improved he does have some increased mobility around the hip and low back area No pain going down the left leg  3/17 /22 LEFT L4-5 Lumbar transforaminal epidural steroid injection under fluoroscopic guidance with contrast enhancement Pain Inventory Average Pain 7 Pain Right Now 7 My pain is constant and stabbing  In the last 24 hours, has pain interfered with the following? General activity 7 Relation with others 7 Enjoyment of life 7 What TIME of day is your pain at its worst? morning , daytime, evening and night Sleep (in general) Poor  Pain is worse with: walking, bending, sitting, standing and some activites Pain improves with: rest and TENS Relief from Meds: 2  No family history on file. Social History   Socioeconomic History  . Marital status: Married    Spouse name: Not on file  . Number of children: Not on file  . Years of education: Not on file  . Highest education level: Not on file  Occupational History  . Not on file  Tobacco Use  . Smoking status: Former Smoker    Packs/day: 0.20    Years: 3.00    Pack years: 0.60    Types: Cigarettes  . Smokeless tobacco: Never Used  Vaping Use  . Vaping Use: Never used  Substance and Sexual Activity  . Alcohol use: Yes    Alcohol/week: 6.0 standard drinks    Types: 6 Cans of beer per week  . Drug use: No  . Sexual activity: Not on file  Other Topics Concern  . Not on file  Social History Narrative  . Not on file   Social Determinants of Health   Financial Resource Strain: Not on file  Food Insecurity: Not on  file  Transportation Needs: Not on file  Physical Activity: Not on file  Stress: Not on file  Social Connections: Not on file   Past Surgical History:  Procedure Laterality Date  . HEMORRHOID SURGERY    . LUMBAR LAMINECTOMY/DECOMPRESSION MICRODISCECTOMY Left 11/11/2012   Procedure: LUMBAR LAMINECTOMY/DECOMPRESSION MICRODISCECTOMY 1 LEVEL;  Surgeon: Temple Pacini, MD;  Location: [facility];  Service: Neurosurgery;  Laterality: Left;  left five sacral one  . SPINAL FUSION    . TONSILECTOMY, ADENOIDECTOMY, BILATERAL MYRINGOTOMY AND TUBES     tubes 16 times  . TONSILLECTOMY     Past Surgical History:  Procedure Laterality Date  . HEMORRHOID SURGERY    . LUMBAR LAMINECTOMY/DECOMPRESSION MICRODISCECTOMY Left 11/11/2012   Procedure: LUMBAR LAMINECTOMY/DECOMPRESSION MICRODISCECTOMY 1 LEVEL;  Surgeon: Temple Pacini, MD;  Location: [facility];  Service: Neurosurgery;  Laterality: Left;  left five sacral one  . SPINAL FUSION    . TONSILECTOMY, ADENOIDECTOMY, BILATERAL MYRINGOTOMY AND TUBES     tubes 16 times  . TONSILLECTOMY     Past Medical History:  Diagnosis Date  . Chronic back pain   . High cholesterol   . Mental disorder   . Migraine    Takes topamax  . PTSD (post-traumatic stress disorder)   . Sleep apnea    BP 121/79  Pulse 83   Temp 98.7 F (37.1 C)   Ht 5\' 10"  (1.778 m)   Wt 223 lb (101.2 kg)   SpO2 96%   BMI 32.00 kg/m   Opioid Risk Score:   Fall Risk Score:  `1  Depression screen PHQ 2/9  Depression screen PHQ 2/9 09/10/2020  Decreased Interest 2  Down, Depressed, Hopeless 2  PHQ - 2 Score 4  Altered sleeping 2  Tired, decreased energy 0  Change in appetite 2  Feeling bad or failure about yourself  2  Trouble concentrating 1  Moving slowly or fidgety/restless 0  Suicidal thoughts 0  PHQ-9 Score 11    Review of Systems  Constitutional: Negative.   HENT: Negative.   Eyes: Negative.   Respiratory: Negative.   Cardiovascular: Negative.    Gastrointestinal: Negative.   Endocrine: Negative.   Genitourinary: Negative.   Musculoskeletal: Positive for back pain.  Allergic/Immunologic: Negative.   Neurological: Negative.   Hematological: Negative.   Psychiatric/Behavioral: Negative.   All other systems reviewed and are negative.      Objective:   Physical Exam Vitals reviewed.  Constitutional:      Appearance: He is obese.  HENT:     Head: Normocephalic and atraumatic.  Eyes:     Extraocular Movements: Extraocular movements intact.     Conjunctiva/sclera: Conjunctivae normal.     Pupils: Pupils are equal, round, and reactive to light.  Musculoskeletal:     Comments: Lumbar range of motion 50% of normal with flexion extension lateral bending and twisting. Mild tenderness palpation around the L4-L5 area bilaterally.  Neurological:     Mental Status: He is alert and oriented to person, place, and time.     Cranial Nerves: No cranial nerve deficit or dysarthria.     Sensory: Sensation is intact.     Motor: Motor function is intact. No weakness.     Coordination: Coordination is intact.     Gait: Gait is intact.     Comments: Motor strength is 5/5 bilateral hip flexor knee extensor ankle dorsiflexor Negative straight leg raising bilaterally  Psychiatric:        Mood and Affect: Mood normal.        Behavior: Behavior normal.        Assessment:     #1.  Lumbar postlaminectomy syndrome status post L4-5 fusion.  He did not have any significant relief of his left-sided low back pain after performing medial branch blocks to the L3-4 facet joint complex.  He did have good temporary relief with L4-5 (foraminal lumbar epidural steroid injection under fluoroscopic guidance. Improving lumbar flexibility as well as core strength with physical therapy    Plan:     #1.  Repeat left L4 5 transforaminal lumbar epidural steroid injection under fluoroscopic guidance 2.  Continue physical therapy recommended 8 more sessions

## 2020-12-30 NOTE — Therapy (Signed)
[facility] [address] [place], [place], [address] Phone: [phone]   Fax:  [phone]  Physical Therapy Treatment  Patient Details  Name: Johnny Clarke MRN: [id number] Date of Birth: [birth year] Referring Provider (PT): Wynn Banker Victorino Sparrow, MD   Encounter Date: 12/29/2020   PT End of Session - 12/30/20 0954    Visit Number 8    Number of Visits 17    Date for PT Re-Evaluation 01/26/21    Authorization Type [facility]    PT Start Time 1430    PT Stop Time 1512    PT Time Calculation (min) 42 min    Activity Tolerance Patient tolerated treatment well    Behavior During Therapy [facility] for tasks assessed/performed           Past Medical History:  Diagnosis Date  . Chronic back pain   . High cholesterol   . Mental disorder   . Migraine    Takes topamax  . PTSD (post-traumatic stress disorder)   . Sleep apnea     Past Surgical History:  Procedure Laterality Date  . HEMORRHOID SURGERY    . LUMBAR LAMINECTOMY/DECOMPRESSION MICRODISCECTOMY Left 11/11/2012   Procedure: LUMBAR LAMINECTOMY/DECOMPRESSION MICRODISCECTOMY 1 LEVEL;  Surgeon: Temple Pacini, MD;  Location: [facility];  Service: Neurosurgery;  Laterality: Left;  left five sacral one  . SPINAL FUSION    . TONSILECTOMY, ADENOIDECTOMY, BILATERAL MYRINGOTOMY AND TUBES     tubes 16 times  . TONSILLECTOMY      There were no vitals filed for this visit.   Subjective Assessment - 12/29/20 1439    Subjective Patient reports his back is feeling pretty good today. He reports he is n0t having much pain but he reports his pain is ablut an 8/10. He reports the needling helped.    Pertinent History Chronic back pain, hyperlipidemia, PTSD, sleep apnea. L5-S1 lumbar hemilaminotomy 2014; TLIF left L5-S1 Dr Charna Archer 04/07/2020    Limitations Standing;Walking    How long can you sit comfortably? Constant pain    How long can you stand comfortably? Unable to stand for too long    How  long can you walk comfortably? 122 steps and L LE gets numb; uses wheelchair for primary mobility    Currently in Pain? Yes    Pain Score 8    explained pain scale but patient continues to report an 8/10   Pain Location Back    Pain Orientation Lower    Pain Descriptors / Indicators Aching    Pain Type Chronic pain    Pain Onset More than a month ago    Pain Frequency Constant    Aggravating Factors  bending and lifting    Pain Relieving Factors laying down or taking meds    Effect of Pain on Daily Activities unable to perfrom ADL;s                             [facility] Adult PT Treatment/Exercise - 12/30/20 0001      Lumbar Exercises: Stretches   Passive Hamstring Stretch Limitations seaetd 2x20 sec hold    Piriformis Stretch Limitations 2x20 sec hold      Lumbar Exercises: Seated   Other Seated Lumbar Exercises scap retraction 2x10 red; shoulder extneison with abdominal breathing 2x10;      Lumbar Exercises: Supine   Clam 20 reps    Clam Limitations green tband with PPT    Bridge 20  reps    Other Supine Lumbar Exercises supine march with abdominal breathing      Manual Therapy   Soft tissue mobilization Lt QL and lower thoracic & upper lumbar paraspinals using IASTYM                  PT Education - 12/30/20 0952    Education Details reviewed exercises program and updated HEP    Person(s) Educated Patient    Methods Explanation;Demonstration;Tactile cues;Verbal cues    Comprehension Verbalized understanding;Returned demonstration;Verbal cues required;Tactile cues required            PT Short Term Goals - 12/27/20 1129      PT SHORT TERM GOAL #1   Title Pt will be independent with initial stretching    Time 4    Period Weeks    Status Achieved    Target Date 12/29/20      PT SHORT TERM GOAL #2   Title Pt will report pain reduction by at least 25% with lumbar ROM    Baseline 6/10 (12/01/20); increases to 7/10 with movement 4/25    Time 4     Period Weeks    Status On-going    Target Date 12/29/20      PT SHORT TERM GOAL #3   Title Pt will be able to increase lumbar flexion to at least 20% for improved reach    Baseline Currently only has 10% lumbar flexion (12/01/20)    Time 4    Period Weeks    Status Achieved    Target Date 12/29/20             PT Long Term Goals - 12/02/20 0937      PT LONG TERM GOAL #1   Title Pt will be independent with final HEP    Time 8    Period Weeks    Status New    Target Date 01/27/21      PT LONG TERM GOAL #2   Title Pt will be able to perform his own ADLs below his knee with or without reacher/grabber/long handled sponge    Baseline Wife currently assists (12/01/20)    Time 8    Period Weeks    Status New    Target Date 01/27/21      PT LONG TERM GOAL #3   Title Pt will be able to walk >122 steps without L LE numbness for improved home/limited community amb    Time 8    Period Weeks    Status New    Target Date 01/27/21                 Plan - 12/29/20 1508    Clinical Impression Statement Patients paraspinals felt looser today but he continues to have a significant spasm in his QL. Therapy perfromed IASTYM to that area and he flet some improvement. We also perfromed light LAD to reduce inflamation in his spine and improve hip mobiton. He was given UE exercises. He tolerated well. therapy will continue to progress as tolerated. Therapy reviewed his exercise program and updated for new stretching.    Personal Factors and Comorbidities Age;Time since onset of injury/illness/exacerbation;Comorbidity 1    Comorbidities Chronic back pain, hyperlipidemia, PTSD, sleep apnea. L5-S1 lumbar hemilaminotomy 2014; TLIF left L5-S1 Dr Charna Archer 04/07/2020    Examination-Activity Limitations Bathing;Sit;Stand;Hygiene/Grooming;Locomotion Level;Caring for Others;Bend;Transfers;Stairs;Squat    Examination-Participation Restrictions Cleaning;Community Activity;Interpersonal Relationship;Yard  Work    Conservation officer, historic buildings Evolving/Moderate complexity    Clinical  Decision Making Moderate    Rehab Potential Good    PT Frequency 2x / week    PT Duration 8 weeks    PT Treatment/Interventions ADLs/Self Care Home Management;Aquatic Therapy;Cryotherapy;Electrical Stimulation;Iontophoresis 4mg /ml Dexamethasone;Moist Heat;Ultrasound;DME Instruction;Gait training;Stair training;Functional mobility training;Therapeutic activities;Therapeutic exercise;Balance training;Neuromuscular re-education;Patient/family education;Manual techniques;Passive range of motion;Dry needling;Taping;Vasopneumatic Device    PT Next Visit Plan progress hip hinge in standing if appropriate, continue core and hip strengthening. Check pelvic rotation and address as needed.    PT Home Exercise Plan [id number]    Consulted and Agree with Plan of Care Patient           Patient will benefit from skilled therapeutic intervention in order to improve the following deficits and impairments:  Abnormal gait,Hypomobility,Decreased activity tolerance,Decreased strength,Increased fascial restricitons  Visit Diagnosis: Chronic midline low back pain with left-sided sciatica  Muscle weakness (generalized)  Difficulty in walking, not elsewhere classified     Problem List Patient Active Problem List   Diagnosis Date Noted  . OSA (obstructive sleep apnea) 03/04/2014  . Insomnia 03/04/2014  . 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 PTSD (post-traumatic stress disorder) 03/04/2014    05/05/2014 PT DPT  12/30/2020, 10:24 AM  [facility] [address] [place], [place], [place] Phone: [phone]   Fax:  [phone]  Name: Johnny Clarke MRN: [name] Date of Birth: Jun 27, 1976

## 2020-12-30 NOTE — Patient Instructions (Signed)
Cont PT I rec doing the maximum 15 visits  Will repeat epidural injection Left L4-5  Level 1 or 2 more times

## 2021-01-03 ENCOUNTER — Other Ambulatory Visit: Payer: Self-pay

## 2021-01-03 ENCOUNTER — Encounter (HOSPITAL_BASED_OUTPATIENT_CLINIC_OR_DEPARTMENT_OTHER): Payer: Self-pay | Admitting: Physical Therapy

## 2021-01-03 ENCOUNTER — Ambulatory Visit (HOSPITAL_BASED_OUTPATIENT_CLINIC_OR_DEPARTMENT_OTHER)
Payer: No Typology Code available for payment source | Attending: Physical Medicine & Rehabilitation | Admitting: Physical Therapy

## 2021-01-03 DIAGNOSIS — G8929 Other chronic pain: Secondary | ICD-10-CM | POA: Diagnosis present

## 2021-01-03 DIAGNOSIS — M5442 Lumbago with sciatica, left side: Secondary | ICD-10-CM | POA: Diagnosis present

## 2021-01-03 DIAGNOSIS — R262 Difficulty in walking, not elsewhere classified: Secondary | ICD-10-CM | POA: Diagnosis present

## 2021-01-03 DIAGNOSIS — M6281 Muscle weakness (generalized): Secondary | ICD-10-CM | POA: Insufficient documentation

## 2021-01-03 NOTE — Therapy (Signed)
[facility] [address] [place], [place], [address] Phone: [phone]   Fax:  [phone]  Physical Therapy Treatment  Patient Details  Name: Johnny Clarke MRN: [id number] Date of Birth: 1976-06-25 Referring Provider (PT): Wynn Banker Victorino Sparrow, MD   Encounter Date: 01/03/2021   PT End of Session - 01/03/21 1131    Visit Number 9    Number of Visits 17    Date for PT Re-Evaluation 01/26/21    PT Start Time 1100    PT Stop Time 1140    PT Time Calculation (min) 40 min    Activity Tolerance Patient tolerated treatment well    Behavior During Therapy [facility] for tasks assessed/performed           Past Medical History:  Diagnosis Date  . Chronic back pain   . High cholesterol   . Mental disorder   . Migraine    Takes topamax  . PTSD (post-traumatic stress disorder)   . Sleep apnea     Past Surgical History:  Procedure Laterality Date  . HEMORRHOID SURGERY    . LUMBAR LAMINECTOMY/DECOMPRESSION MICRODISCECTOMY Left 11/11/2012   Procedure: LUMBAR LAMINECTOMY/DECOMPRESSION MICRODISCECTOMY 1 LEVEL;  Surgeon: Temple Pacini, MD;  Location: [facility];  Service: Neurosurgery;  Laterality: Left;  left five sacral one  . SPINAL FUSION    . TONSILECTOMY, ADENOIDECTOMY, BILATERAL MYRINGOTOMY AND TUBES     tubes 16 times  . TONSILLECTOMY      There were no vitals filed for this visit.   Subjective Assessment - 01/03/21 1125    Subjective Patient reports his pain is somewhat better. His pain is maybe a 6/10 today. He has been working on his stretches and exercsies.    Pertinent History Chronic back pain, hyperlipidemia, PTSD, sleep apnea. L5-S1 lumbar hemilaminotomy 2014; TLIF left L5-S1 Dr Charna Archer 04/07/2020    Limitations Standing;Walking    How long can you sit comfortably? Constant pain    How long can you stand comfortably? Unable to stand for too long    How long can you walk comfortably? 122 steps and L LE gets numb; uses  wheelchair for primary mobility    Patient Stated Goals More back flexibility    Currently in Pain? Yes    Pain Score 6     Pain Location Back    Pain Orientation Lower    Pain Descriptors / Indicators Aching    Pain Type Chronic pain    Pain Onset More than a month ago    Pain Frequency Constant    Aggravating Factors  bending and lifting    Pain Relieving Factors laying down and taking pain meds    Effect of Pain on Daily Activities unable to perfrom ADL's                             [facility] Adult PT Treatment/Exercise - 01/03/21 0001      Lumbar Exercises: Stretches   Passive Hamstring Stretch Limitations seaetd 2x20 sec hold    Lower Trunk Rotation Limitations x20 each side 2 second hold    Piriformis Stretch Limitations 2x20 sec hold    Other Lumbar Stretch Exercise ball press with legs at 90/90; double knee to chest at 90/90      Lumbar Exercises: Seated   Other Seated Lumbar Exercises scap retraction 2x10 green; shoulder extneison with abdominal breathing 2x10 green ;      Lumbar Exercises: Supine  Clam 20 reps    Clam Limitations green tband with PPT    Bridge 20 reps    Other Supine Lumbar Exercises double knee to chest with ball 2x10; ball press with legs at 90/90    Other Supine Lumbar Exercises supine march with abdominal breathing      Manual Therapy   Joint Mobilization LAD bilateral    Soft tissue mobilization Lt QL and lower thoracic & upper lumbar paraspinals using IASTYM            Trigger Point Dry Needling - 01/03/21 0001    Consent Given? Yes    Education Handout Provided Yes    Muscles Treated Back/Hip Quadratus lumborum;Gluteus medius    Gluteus Medius Response Twitch response elicited;Palpable increased muscle length    Quadratus Lumborum Response Twitch response elicited;Palpable increased muscle length                PT Education - 01/03/21 1127    Education Details reviewed benefits and risks of TPDN; reviewed  decompression ther-ex on his back    Person(s) Educated Patient    Methods Explanation;Demonstration;Tactile cues;Verbal cues    Comprehension Verbalized understanding;Returned demonstration;Verbal cues required;Tactile cues required            PT Short Term Goals - 12/27/20 1129      PT SHORT TERM GOAL #1   Title Pt will be independent with initial stretching    Time 4    Period Weeks    Status Achieved    Target Date 12/29/20      PT SHORT TERM GOAL #2   Title Pt will report pain reduction by at least 25% with lumbar ROM    Baseline 6/10 (12/01/20); increases to 7/10 with movement 4/25    Time 4    Period Weeks    Status On-going    Target Date 12/29/20      PT SHORT TERM GOAL #3   Title Pt will be able to increase lumbar flexion to at least 20% for improved reach    Baseline Currently only has 10% lumbar flexion (12/01/20)    Time 4    Period Weeks    Status Achieved    Target Date 12/29/20             PT Long Term Goals - 12/02/20 0937      PT LONG TERM GOAL #1   Title Pt will be independent with final HEP    Time 8    Period Weeks    Status New    Target Date 01/27/21      PT LONG TERM GOAL #2   Title Pt will be able to perform his own ADLs below his knee with or without reacher/grabber/long handled sponge    Baseline Wife currently assists (12/01/20)    Time 8    Period Weeks    Status New    Target Date 01/27/21      PT LONG TERM GOAL #3   Title Pt will be able to walk >122 steps without L LE numbness for improved home/limited community amb    Time 8    Period Weeks    Status New    Target Date 01/27/21                 Plan - 01/03/21 1131    Clinical Impression Statement Patient continues to have a large trigger point in his QL/ upper gluteal area. It is smaller today but still  there. He had a good twtich with needling He toleratd ther-ex well. We added core strengthening from a decompressed position. He tolerated new exercsises well. Upon  arrival the patients wife asked therapist if patients were taken off campus. Therapy assured her that patients were not taken off campus. She asked if a picture was out therpaist. It did not appear to be. She then abruptly left. The session continues without further incedent.    Personal Factors and Comorbidities Age;Time since onset of injury/illness/exacerbation;Comorbidity 1    Comorbidities Chronic back pain, hyperlipidemia, PTSD, sleep apnea. L5-S1 lumbar hemilaminotomy 2014; TLIF left L5-S1 Dr Charna Archer 04/07/2020    Examination-Activity Limitations Bathing;Sit;Stand;Hygiene/Grooming;Locomotion Level;Caring for Others;Bend;Transfers;Stairs;Squat    Examination-Participation Restrictions Cleaning;Community Activity;Interpersonal Relationship;Yard Work    Conservation officer, historic buildings Evolving/Moderate complexity    Clinical Decision Making Moderate    Rehab Potential Good    PT Frequency 2x / week    PT Duration 8 weeks    PT Treatment/Interventions ADLs/Self Care Home Management;Aquatic Therapy;Cryotherapy;Electrical Stimulation;Iontophoresis 4mg /ml Dexamethasone;Moist Heat;Ultrasound;DME Instruction;Gait training;Stair training;Functional mobility training;Therapeutic activities;Therapeutic exercise;Balance training;Neuromuscular re-education;Patient/family education;Manual techniques;Passive range of motion;Dry needling;Taping;Vasopneumatic Device    PT Next Visit Plan progress hip hinge in standing if appropriate, continue core and hip strengthening. Check pelvic rotation and address as needed.    PT Home Exercise Plan [id number]    Consulted and Agree with Plan of Care Patient           Patient will benefit from skilled therapeutic intervention in order to improve the following deficits and impairments:  Abnormal gait,Hypomobility,Decreased activity tolerance,Decreased strength,Increased fascial restricitons  Visit Diagnosis: Chronic midline low back pain with left-sided  sciatica  Muscle weakness (generalized)  Difficulty in walking, not elsewhere classified     Problem List Patient Active Problem List   Diagnosis Date Noted  . OSA (obstructive sleep apnea) 03/04/2014  . Insomnia 03/04/2014  . 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 PTSD (post-traumatic stress disorder) 03/04/2014    05/05/2014 PT DPT  01/03/2021, 1:59 PM  [facility] [address] [place], [place], [place] Phone: [phone]   Fax:  [phone]  Name: Johnny Clarke MRN: [name] Date of Birth: Nov 13, 1975

## 2021-01-05 ENCOUNTER — Other Ambulatory Visit: Payer: Self-pay

## 2021-01-05 ENCOUNTER — Ambulatory Visit (HOSPITAL_BASED_OUTPATIENT_CLINIC_OR_DEPARTMENT_OTHER): Payer: No Typology Code available for payment source | Admitting: Physical Therapy

## 2021-01-05 ENCOUNTER — Encounter (HOSPITAL_BASED_OUTPATIENT_CLINIC_OR_DEPARTMENT_OTHER): Payer: Self-pay | Admitting: Physical Therapy

## 2021-01-05 DIAGNOSIS — R262 Difficulty in walking, not elsewhere classified: Secondary | ICD-10-CM

## 2021-01-05 DIAGNOSIS — G8929 Other chronic pain: Secondary | ICD-10-CM

## 2021-01-05 DIAGNOSIS — M6281 Muscle weakness (generalized): Secondary | ICD-10-CM

## 2021-01-05 DIAGNOSIS — M5442 Lumbago with sciatica, left side: Secondary | ICD-10-CM | POA: Diagnosis not present

## 2021-01-06 ENCOUNTER — Encounter (HOSPITAL_BASED_OUTPATIENT_CLINIC_OR_DEPARTMENT_OTHER): Payer: Self-pay | Admitting: Physical Therapy

## 2021-01-06 NOTE — Therapy (Signed)
[facility] [address] [place], [place], [address] Phone: [phone]   Fax:  [phone]  Physical Therapy Treatment  Patient Details  Name: Johnny Clarke MRN: [id number] Date of Birth: Apr 05, 1976 Referring Provider (PT): Wynn Banker Victorino Sparrow, MD   Encounter Date: 01/05/2021   PT End of Session - 01/05/21 2142    Visit Number 10    Number of Visits 17    Date for PT Re-Evaluation 01/26/21    Authorization Type [facility]    PT Start Time 1100    PT Stop Time 1142    PT Time Calculation (min) 42 min    Activity Tolerance Patient tolerated treatment well    Behavior During Therapy [facility] for tasks assessed/performed           Past Medical History:  Diagnosis Date  . Chronic back pain   . High cholesterol   . Mental disorder   . Migraine    Takes topamax  . PTSD (post-traumatic stress disorder)   . Sleep apnea     Past Surgical History:  Procedure Laterality Date  . HEMORRHOID SURGERY    . LUMBAR LAMINECTOMY/DECOMPRESSION MICRODISCECTOMY Left 11/11/2012   Procedure: LUMBAR LAMINECTOMY/DECOMPRESSION MICRODISCECTOMY 1 LEVEL;  Surgeon: Temple Pacini, MD;  Location: [facility];  Service: Neurosurgery;  Laterality: Left;  left five sacral one  . SPINAL FUSION    . TONSILECTOMY, ADENOIDECTOMY, BILATERAL MYRINGOTOMY AND TUBES     tubes 16 times  . TONSILLECTOMY      There were no vitals filed for this visit.   Subjective Assessment - 01/05/21 2135    Subjective Patient continues to have pain but it has improved somewhat. he is a little more sore today becuase he just drove to the [place]. He has been working on his stretches and exercises. he tolerated therapy well last visit.    Pertinent History Chronic back pain, hyperlipidemia, PTSD, sleep apnea. L5-S1 lumbar hemilaminotomy 2014; TLIF left L5-S1 Dr Charna Archer 04/07/2020    Limitations Standing;Walking    How long can you sit comfortably? Constant pain    How long can you stand  comfortably? Unable to stand for too long    How long can you walk comfortably? 122 steps and L LE gets numb; uses wheelchair for primary mobility    Patient Stated Goals More back flexibility    Currently in Pain? Yes    Pain Score 6     Pain Location Back    Pain Orientation Lower    Pain Descriptors / Indicators Aching    Pain Type Chronic pain    Pain Onset More than a month ago    Pain Frequency Constant    Aggravating Factors  bending and lifting    Pain Relieving Factors pain medication    Effect of Pain on Daily Activities difficutly walking more then 122 feet.                             [facility] Adult PT Treatment/Exercise - 01/06/21 0001      Lumbar Exercises: Stretches   Passive Hamstring Stretch Limitations seaetd 2x20 sec hold    Lower Trunk Rotation Limitations x20 each side 2 second hold    Piriformis Stretch Limitations 2x20 sec hold      Lumbar Exercises: Machines for Strengthening   Other Lumbar Machine Exercise chop 10lbs 2x10; pallof press 2x10; tricpes extension 2x10: all with cuing for posture and breathing.  Lumbar Exercises: Supine   Clam 20 reps    Clam Limitations green tband with PPT    Bridge 20 reps    Other Supine Lumbar Exercises supine march with abdominal breathing      Manual Therapy   Joint Mobilization LAD bilateral    Soft tissue mobilization Lt QL and lower thoracic & upper lumbar paraspinals using IASTYM                  PT Education - 01/05/21 2141    Education Details reviewed exercises with core    Person(s) Educated Patient    Methods Explanation;Demonstration;Tactile cues;Verbal cues    Comprehension Verbalized understanding;Returned demonstration;Verbal cues required;Tactile cues required            PT Short Term Goals - 12/27/20 1129      PT SHORT TERM GOAL #1   Title Pt will be independent with initial stretching    Time 4    Period Weeks    Status Achieved    Target Date 12/29/20       PT SHORT TERM GOAL #2   Title Pt will report pain reduction by at least 25% with lumbar ROM    Baseline 6/10 (12/01/20); increases to 7/10 with movement 4/25    Time 4    Period Weeks    Status On-going    Target Date 12/29/20      PT SHORT TERM GOAL #3   Title Pt will be able to increase lumbar flexion to at least 20% for improved reach    Baseline Currently only has 10% lumbar flexion (12/01/20)    Time 4    Period Weeks    Status Achieved    Target Date 12/29/20             PT Long Term Goals - 12/02/20 0937      PT LONG TERM GOAL #1   Title Pt will be independent with final HEP    Time 8    Period Weeks    Status New    Target Date 01/27/21      PT LONG TERM GOAL #2   Title Pt will be able to perform his own ADLs below his knee with or without reacher/grabber/long handled sponge    Baseline Wife currently assists (12/01/20)    Time 8    Period Weeks    Status New    Target Date 01/27/21      PT LONG TERM GOAL #3   Title Pt will be able to walk >122 steps without L LE numbness for improved home/limited community amb    Time 8    Period Weeks    Status New    Target Date 01/27/21                 Plan - 01/05/21 1139    Clinical Impression Statement Patient able to tolerate light gym exercises well. he had an increase in uslce tightness in the lower back compared to last visit. He was able to tolerate manua therapy well. Therapy will continue to progress ther-ex with the patient. We will continue with dry needling next visit.    Personal Factors and Comorbidities Age;Time since onset of injury/illness/exacerbation;Comorbidity 1    Comorbidities Chronic back pain, hyperlipidemia, PTSD, sleep apnea. L5-S1 lumbar hemilaminotomy 2014; TLIF left L5-S1 Dr Charna Archer 04/07/2020    Examination-Activity Limitations Bathing;Sit;Stand;Hygiene/Grooming;Locomotion Level;Caring for Others;Bend;Transfers;Stairs;Squat    Examination-Participation Restrictions Cleaning;Community  Activity;Interpersonal Relationship;Yard Work    Stability/Clinical  Decision Making Evolving/Moderate complexity    Clinical Decision Making Moderate    Rehab Potential Good    PT Frequency 2x / week    PT Duration 8 weeks    PT Treatment/Interventions ADLs/Self Care Home Management;Aquatic Therapy;Cryotherapy;Electrical Stimulation;Iontophoresis 4mg /ml Dexamethasone;Moist Heat;Ultrasound;DME Instruction;Gait training;Stair training;Functional mobility training;Therapeutic activities;Therapeutic exercise;Balance training;Neuromuscular re-education;Patient/family education;Manual techniques;Passive range of motion;Dry needling;Taping;Vasopneumatic Device    PT Next Visit Plan continue with core strengthneing    PT Home Exercise Plan [id number]    Consulted and Agree with Plan of Care Patient           Patient will benefit from skilled therapeutic intervention in order to improve the following deficits and impairments:  Abnormal gait,Hypomobility,Decreased activity tolerance,Decreased strength,Increased fascial restricitons  Visit Diagnosis: Chronic midline low back pain with left-sided sciatica  Muscle weakness (generalized)  Difficulty in walking, not elsewhere classified     Problem List Patient Active Problem List   Diagnosis Date Noted  . OSA (obstructive sleep apnea) 03/04/2014  . Insomnia 03/04/2014  . 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 PTSD (post-traumatic stress disorder) 03/04/2014    05/05/2014 PT DPT  01/06/2021, 1:38 PM  [facility] [address] [place], [place], [place] Phone: [phone]   Fax:  [phone]  Name: Johnny Clarke MRN: [name] Date of Birth: 01-Mar-1976

## 2021-01-10 ENCOUNTER — Other Ambulatory Visit: Payer: Self-pay

## 2021-01-10 ENCOUNTER — Encounter (HOSPITAL_BASED_OUTPATIENT_CLINIC_OR_DEPARTMENT_OTHER): Payer: Self-pay | Admitting: Physical Therapy

## 2021-01-10 ENCOUNTER — Ambulatory Visit (HOSPITAL_BASED_OUTPATIENT_CLINIC_OR_DEPARTMENT_OTHER): Payer: No Typology Code available for payment source | Admitting: Physical Therapy

## 2021-01-10 DIAGNOSIS — M5442 Lumbago with sciatica, left side: Secondary | ICD-10-CM | POA: Diagnosis not present

## 2021-01-10 DIAGNOSIS — R262 Difficulty in walking, not elsewhere classified: Secondary | ICD-10-CM

## 2021-01-10 DIAGNOSIS — G8929 Other chronic pain: Secondary | ICD-10-CM

## 2021-01-10 DIAGNOSIS — M6281 Muscle weakness (generalized): Secondary | ICD-10-CM

## 2021-01-10 NOTE — Therapy (Signed)
[facility] [address] [place], [place], [address] Phone: [phone]   Fax:  [phone]  Physical Therapy Treatment/progress note  Patient Details  Name: Johnny Clarke MRN: [id number] Date of Birth: Jan 18, 1976 Referring Provider (PT): Wynn Banker Victorino Sparrow, MD   Encounter Date: 01/10/2021   PT End of Session - 01/10/21 1127    Visit Number 11    Number of Visits 15    Date for PT Re-Evaluation 02/21/21    PT Start Time 1054    PT Stop Time 1140    PT Time Calculation (min) 46 min    Activity Tolerance Patient tolerated treatment well    Behavior During Therapy [facility] for tasks assessed/performed           Past Medical History:  Diagnosis Date  . Chronic back pain   . High cholesterol   . Mental disorder   . Migraine    Takes topamax  . PTSD (post-traumatic stress disorder)   . Sleep apnea     Past Surgical History:  Procedure Laterality Date  . HEMORRHOID SURGERY    . LUMBAR LAMINECTOMY/DECOMPRESSION MICRODISCECTOMY Left 11/11/2012   Procedure: LUMBAR LAMINECTOMY/DECOMPRESSION MICRODISCECTOMY 1 LEVEL;  Surgeon: Temple Pacini, MD;  Location: [facility];  Service: Neurosurgery;  Laterality: Left;  left five sacral one  . SPINAL FUSION    . TONSILECTOMY, ADENOIDECTOMY, BILATERAL MYRINGOTOMY AND TUBES     tubes 16 times  . TONSILLECTOMY      There were no vitals filed for this visit.   Subjective Assessment - 01/10/21 1125    Subjective Ptient reports his back is a little better. he had no major complaints over the weekend.    Pertinent History Chronic back pain, hyperlipidemia, PTSD, sleep apnea. L5-S1 lumbar hemilaminotomy 2014; TLIF left L5-S1 Dr Charna Archer 04/07/2020    Limitations Standing;Walking    How long can you sit comfortably? Constant pain    How long can you stand comfortably? Unable to stand for too long    How long can you walk comfortably? 122 steps and L LE gets numb; uses wheelchair for primary  mobility    Patient Stated Goals More back flexibility    Currently in Pain? Yes   faces pain scale used today 2/10   Pain Location Back    Pain Orientation Left;Lower    Pain Descriptors / Indicators Aching    Pain Type Chronic pain    Pain Onset More than a month ago    Pain Frequency Constant    Aggravating Factors  bending and lifting    Pain Relieving Factors pain medications    Effect of Pain on Daily Activities difficulty walking distances    Multiple Pain Sites No              [facility] PT Assessment - 01/10/21 0001      Assessment   Medical Diagnosis Chronic back pain    Referring Provider (PT) Kirsteins, Victorino Sparrow, MD      AROM   Lumbar Flexion 45    Lumbar - Right Rotation 25%    Lumbar - Left Rotation 50%      Strength   Right Hip Flexion 5/5    Right Hip ABduction 5/5    Left Hip Flexion 4/5    Left Hip ABduction 4/5      Palpation   Palpation comment continues to have spasming in gluteal although it has improved.      Transfers   Five time  sit to stand comments  21 seconds                         [facility] Adult PT Treatment/Exercise - 01/10/21 0001      Lumbar Exercises: Stretches   Passive Hamstring Stretch Limitations seaetd 2x20 sec hold    Lower Trunk Rotation Limitations x20 each side 2 second hold    Piriformis Stretch Limitations 3x20 sec hold      Lumbar Exercises: Machines for Strengthening   Other Lumbar Machine Exercise hip abduction 20lbs 3x10    Other Lumbar Machine Exercise knee extension 3x10 10 lbs      Manual Therapy   Manual therapy comments skille dpalpation of trigger points    Joint Mobilization LAD bilateral    Soft tissue mobilization Lt QL and lower thoracic & upper lumbar paraspinals using IASTYM            Trigger Point Dry Needling - 01/10/21 0001    Consent Given? Yes    Education Handout Provided Yes    Muscles Treated Back/Hip Quadratus lumborum;Gluteus medius    Lumbar multifidi Response Twitch  response elicited    Quadratus Lumborum Response Twitch response elicited;Palpable increased muscle length                PT Education - 01/10/21 1126    Education Details reviewed gym exercises    Person(s) Educated Patient    Methods Explanation;Demonstration;Tactile cues;Verbal cues    Comprehension Verbalized understanding;Returned demonstration;Verbal cues required;Tactile cues required            PT Short Term Goals - 12/27/20 1129      PT SHORT TERM GOAL #1   Title Pt will be independent with initial stretching    Time 4    Period Weeks    Status Achieved    Target Date 12/29/20      PT SHORT TERM GOAL #2   Title Pt will report pain reduction by at least 25% with lumbar ROM    Baseline 6/10 (12/01/20); increases to 7/10 with movement 4/25    Time 4    Period Weeks    Status On-going    Target Date 12/29/20      PT SHORT TERM GOAL #3   Title Pt will be able to increase lumbar flexion to at least 20% for improved reach    Baseline Currently only has 10% lumbar flexion (12/01/20)    Time 4    Period Weeks    Status Achieved    Target Date 12/29/20             PT Long Term Goals - 12/02/20 0937      PT LONG TERM GOAL #1   Title Pt will be independent with final HEP    Time 8    Period Weeks    Status New    Target Date 01/27/21      PT LONG TERM GOAL #2   Title Pt will be able to perform his own ADLs below his knee with or without reacher/grabber/long handled sponge    Baseline Wife currently assists (12/01/20)    Time 8    Period Weeks    Status New    Target Date 01/27/21      PT LONG TERM GOAL #3   Title Pt will be able to walk >122 steps without L LE numbness for improved home/limited community amb    Time 8  Period Weeks    Status New    Target Date 01/27/21                 Plan - 01/10/21 1128    Clinical Impression Statement Patient continues to make progress. he feles like he can stand and walk more. He continues to have  some difficulty bending to pick itesm up. he still has spasming in his lumbar spine but it has improved. He has been having a good response to trigger point dry needling. His strength with left hip abdcution has improved significantly. He would benefit from skilled therapy 1W4 in order to improve his core stability and to continue to work on manual therapy and needling to reduce muslce spasms. His sit to stand time has improved from 36 seconds to 21 seconds.    Personal Factors and Comorbidities Age;Time since onset of injury/illness/exacerbation;Comorbidity 1    Comorbidities Chronic back pain, hyperlipidemia, PTSD, sleep apnea. L5-S1 lumbar hemilaminotomy 2014; TLIF left L5-S1 Dr Charna Archer 04/07/2020    Examination-Activity Limitations Bathing;Sit;Stand;Hygiene/Grooming;Locomotion Level;Caring for Others;Bend;Transfers;Stairs;Squat    Examination-Participation Restrictions Cleaning;Community Activity;Interpersonal Relationship;Yard Work    Stability/Clinical Decision Making Evolving/Moderate complexity    Clinical Decision Making Moderate    PT Frequency 1x / week    PT Duration 4 weeks    PT Treatment/Interventions ADLs/Self Care Home Management;Aquatic Therapy;Cryotherapy;Electrical Stimulation;Iontophoresis 4mg /ml Dexamethasone;Moist Heat;Ultrasound;DME Instruction;Gait training;Stair training;Functional mobility training;Therapeutic activities;Therapeutic exercise;Balance training;Neuromuscular re-education;Patient/family education;Manual techniques;Passive range of motion;Dry needling;Taping;Vasopneumatic Device    PT Next Visit Plan continue with core strengthneing    PT Home Exercise Plan [id number]    Consulted and Agree with Plan of Care Patient           Patient will benefit from skilled therapeutic intervention in order to improve the following deficits and impairments:  Abnormal gait,Hypomobility,Decreased activity tolerance,Decreased strength,Increased fascial restricitons  Visit  Diagnosis: Chronic midline low back pain with left-sided sciatica - Plan: PT plan of care cert/re-cert  Muscle weakness (generalized) - Plan: PT plan of care cert/re-cert  Difficulty in walking, not elsewhere classified - Plan: PT plan of care cert/re-cert     Problem List Patient Active Problem List   Diagnosis Date Noted  . OSA (obstructive sleep apnea) 03/04/2014  . Insomnia 03/04/2014  . 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 PTSD (post-traumatic stress disorder) 03/04/2014    05/05/2014 01/10/2021, 12:59 PM  [facility] [address] [place], [place], [place] Phone: [phone]   Fax:  [phone]  Name: Nima Kemppainen MRN: [name] Date of Birth: 07-05-1976

## 2021-01-13 ENCOUNTER — Encounter
Payer: No Typology Code available for payment source | Attending: Physical Medicine & Rehabilitation | Admitting: Physical Medicine & Rehabilitation

## 2021-01-13 ENCOUNTER — Encounter: Payer: Self-pay | Admitting: Physical Medicine & Rehabilitation

## 2021-01-13 ENCOUNTER — Other Ambulatory Visit: Payer: Self-pay

## 2021-01-13 VITALS — BP 137/77 | HR 77 | Temp 98.1°F | Ht 70.0 in | Wt 218.0 lb

## 2021-01-13 DIAGNOSIS — M5416 Radiculopathy, lumbar region: Secondary | ICD-10-CM | POA: Insufficient documentation

## 2021-01-13 NOTE — Progress Notes (Signed)
  PROCEDURE RECORD [name]   Name: Johnny Clarke DOB:09-10-1975 MRN: [id number]  Date:01/13/2021  Physician: Claudette Laws, MD    Nurse/CMA: Nedra Hai, CMA  Allergies:  Allergies  Allergen Reactions  . Amoxicillin Anaphylaxis, Rash and Shortness Of Breath  . Penicillin G Anaphylaxis and Shortness Of Breath  . Penicillins     Pt reports that he is allergic to all penicillins.    Consent Signed: Yes.    Is patient diabetic? No.  CBG today?   Pregnant: No. LMP: No LMP for male patient. (age 78-55)  Anticoagulants: no Anti-inflammatory: no Antibiotics: no  Procedure:Left L4-5 Transforaminal Epidural Steroid Injection  Position: Prone Start Time: 9:40am End Time: 9:43am Fluoro Time: 22  RN/CMA Nedra Hai, CMA [name], CMA    Time 9.27am 9:50am    BP 137/77 168/82      Pulse 76 82    Respirations 16 16    O2 Sat 96 98    S/S 6 6    Pain Level 6/10 4/10     D/C home with wife, [name] & O X 3, D/C instructions reviewed, and sits independently.

## 2021-01-13 NOTE — Patient Instructions (Signed)

## 2021-01-13 NOTE — Progress Notes (Signed)
LEFT L4-5 Lumbar transforaminal epidural steroid injection under fluoroscopic guidance with contrast enhancement  Indication: Lumbosacral radiculitis is not relieved by medication management or other conservative care and interfering with self-care and mobility.   Informed consent was obtained after describing risk and benefits of the procedure with the patient, this includes bleeding, bruising, infection, paralysis and medication side effects.  The patient wishes to proceed and has given written consent.  Patient was placed in prone position.  The lumbar area was marked and prepped with Betadine.  It was entered with a 25-gauge 1-1/2 inch needle and one mL of 1% lidocaine was injected into the skin and subcutaneous tissue.  Then a 22-gauge 5 in spinal needle was inserted into the Left L4-5  intervertebral foramen under AP, lateral, and oblique view.  Once needle tip was within the foramen on lateral views an dnor exceeding 6 o clock position on th epedical on AP viewed Omnipaque 180 was injected x 45ml Then a solution containing one mL of 10 mg per mL dexamethasone and 2 mL of 1% lidocaine was injected.  The patient tolerated procedure well.  Post procedure instructions were given.  Please see post procedure form.

## 2021-01-18 ENCOUNTER — Ambulatory Visit (HOSPITAL_BASED_OUTPATIENT_CLINIC_OR_DEPARTMENT_OTHER): Payer: No Typology Code available for payment source | Admitting: Physical Therapy

## 2021-01-24 ENCOUNTER — Encounter (HOSPITAL_BASED_OUTPATIENT_CLINIC_OR_DEPARTMENT_OTHER): Payer: Self-pay | Admitting: Physical Therapy

## 2021-01-24 ENCOUNTER — Ambulatory Visit (HOSPITAL_BASED_OUTPATIENT_CLINIC_OR_DEPARTMENT_OTHER): Payer: No Typology Code available for payment source | Admitting: Physical Therapy

## 2021-01-24 ENCOUNTER — Other Ambulatory Visit: Payer: Self-pay

## 2021-01-24 DIAGNOSIS — M5442 Lumbago with sciatica, left side: Secondary | ICD-10-CM | POA: Diagnosis not present

## 2021-01-24 DIAGNOSIS — G8929 Other chronic pain: Secondary | ICD-10-CM

## 2021-01-24 DIAGNOSIS — M6281 Muscle weakness (generalized): Secondary | ICD-10-CM

## 2021-01-24 DIAGNOSIS — R262 Difficulty in walking, not elsewhere classified: Secondary | ICD-10-CM

## 2021-01-24 NOTE — Therapy (Signed)
[facility] [address] [place], [place], [address] Phone: [phone]   Fax:  [phone]  Physical Therapy Treatment  Patient Details  Name: Johnny Clarke MRN: [id number] Date of Birth: 12/25/1975 Referring Provider (PT): Wynn Banker Victorino Sparrow, MD   Encounter Date: 01/24/2021   PT End of Session - 01/24/21 1508    Visit Number 12    Number of Visits 15    Date for PT Re-Evaluation 02/21/21    Authorization Type [place]    PT Start Time 1430    PT Stop Time 1510    PT Time Calculation (min) 40 min    Activity Tolerance Patient tolerated treatment well    Behavior During Therapy [facility] for tasks assessed/performed           Past Medical History:  Diagnosis Date  . Chronic back pain   . High cholesterol   . Mental disorder   . Migraine    Takes topamax  . PTSD (post-traumatic stress disorder)   . Sleep apnea     Past Surgical History:  Procedure Laterality Date  . HEMORRHOID SURGERY    . LUMBAR LAMINECTOMY/DECOMPRESSION MICRODISCECTOMY Left 11/11/2012   Procedure: LUMBAR LAMINECTOMY/DECOMPRESSION MICRODISCECTOMY 1 LEVEL;  Surgeon: Temple Pacini, MD;  Location: [facility];  Service: Neurosurgery;  Laterality: Left;  left five sacral one  . SPINAL FUSION    . TONSILECTOMY, ADENOIDECTOMY, BILATERAL MYRINGOTOMY AND TUBES     tubes 16 times  . TONSILLECTOMY      There were no vitals filed for this visit.   Subjective Assessment - 01/24/21 1437    Subjective Patient comes in after a few weeks out of therapy. He reports his back is about the same. he has been to the [place]. He reports no new updates.    Pertinent History Chronic back pain, hyperlipidemia, PTSD, sleep apnea. L5-S1 lumbar hemilaminotomy 2014; TLIF left L5-S1 Dr Charna Archer 04/07/2020    Limitations Standing;Walking    How long can you sit comfortably? Constant pain    How long can you stand comfortably? Unable to stand for too long    How long can you walk  comfortably? 122 steps and L LE gets numb; uses wheelchair for primary mobility    Patient Stated Goals More back flexibility    Currently in Pain? Yes    Pain Score 6     Pain Location Back    Pain Orientation Left;Lower    Pain Descriptors / Indicators Aching    Pain Type Chronic pain    Pain Onset More than a month ago    Pain Frequency Constant    Aggravating Factors  bending and lifting    Pain Relieving Factors pain medication    Effect of Pain on Daily Activities difficulty walking long distances    Multiple Pain Sites No                             [facility] Adult PT Treatment/Exercise - 01/24/21 0001      Lumbar Exercises: Machines for Strengthening   Other Lumbar Machine Exercise chop 10lbs 2x10; pallof press 2x10; tricpes extension 2x10: all with cuing for posture and breathing. row machine 2x10 bilateral;      Lumbar Exercises: Standing   Other Standing Lumbar Exercises bilateral shoulder flexion 2x10 green      Lumbar Exercises: Supine   Bent Knee Raise Limitations 2x10      Manual Therapy  Manual Therapy Soft tissue mobilization;Joint mobilization    Manual therapy comments skille dpalpation of trigger points    Joint Mobilization LAD bilateral    Soft tissue mobilization Lt QL and lower thoracic & upper lumbar paraspinals using IASTYM                  PT Education - 01/24/21 1504    Education Details reviewed exercises.            PT Short Term Goals - 12/27/20 1129      PT SHORT TERM GOAL #1   Title Pt will be independent with initial stretching    Time 4    Period Weeks    Status Achieved    Target Date 12/29/20      PT SHORT TERM GOAL #2   Title Pt will report pain reduction by at least 25% with lumbar ROM    Baseline 6/10 (12/01/20); increases to 7/10 with movement 4/25    Time 4    Period Weeks    Status On-going    Target Date 12/29/20      PT SHORT TERM GOAL #3   Title Pt will be able to increase lumbar flexion  to at least 20% for improved reach    Baseline Currently only has 10% lumbar flexion (12/01/20)    Time 4    Period Weeks    Status Achieved    Target Date 12/29/20             PT Long Term Goals - 12/02/20 0937      PT LONG TERM GOAL #1   Title Pt will be independent with final HEP    Time 8    Period Weeks    Status New    Target Date 01/27/21      PT LONG TERM GOAL #2   Title Pt will be able to perform his own ADLs below his knee with or without reacher/grabber/long handled sponge    Baseline Wife currently assists (12/01/20)    Time 8    Period Weeks    Status New    Target Date 01/27/21      PT LONG TERM GOAL #3   Title Pt will be able to walk >122 steps without L LE numbness for improved home/limited community amb    Time 8    Period Weeks    Status New    Target Date 01/27/21                 Plan - 01/24/21 1517    Clinical Impression Statement Patient continues to work ongym equipment for light weight high rep exercises. He reported no significant change in his pain. He continues to have spasming in his lumbar spine although it seems to be improving. We will continue to progress as tolerated.    Personal Factors and Comorbidities Age;Time since onset of injury/illness/exacerbation;Comorbidity 1    Comorbidities Chronic back pain, hyperlipidemia, PTSD, sleep apnea. L5-S1 lumbar hemilaminotomy 2014; TLIF left L5-S1 Dr Charna Archer 04/07/2020    Examination-Participation Restrictions Cleaning;Community Activity;Interpersonal Relationship;Yard Work    Conservation officer, historic buildings Evolving/Moderate complexity    Clinical Decision Making Moderate    Rehab Potential Good    PT Frequency 1x / week    PT Duration 4 weeks    PT Treatment/Interventions ADLs/Self Care Home Management;Aquatic Therapy;Cryotherapy;Electrical Stimulation;Iontophoresis 4mg /ml Dexamethasone;Moist Heat;Ultrasound;DME Instruction;Gait training;Stair training;Functional mobility  training;Therapeutic activities;Therapeutic exercise;Balance training;Neuromuscular re-education;Patient/family education;Manual techniques;Passive range of motion;Dry needling;Taping;Vasopneumatic Device  PT Next Visit Plan continue with core strengthneing    PT Home Exercise Plan [id number]    Consulted and Agree with Plan of Care Patient           Patient will benefit from skilled therapeutic intervention in order to improve the following deficits and impairments:  Abnormal gait,Hypomobility,Decreased activity tolerance,Decreased strength,Increased fascial restricitons  Visit Diagnosis: Chronic midline low back pain with left-sided sciatica  Muscle weakness (generalized)  Difficulty in walking, not elsewhere classified     Problem List Patient Active Problem List   Diagnosis Date Noted  . OSA (obstructive sleep apnea) 03/04/2014  . Insomnia 03/04/2014  . 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 PTSD (post-traumatic stress disorder) 03/04/2014    Dessie Coma  PT DPT  01/24/2021, 3:27 PM  [facility] [address] [place], [place], [address] Phone: [phone]   Fax:  [phone]  Name: Johnny Clarke MRN: [id number] Date of Birth: June 03, 1976

## 2021-02-03 ENCOUNTER — Other Ambulatory Visit: Payer: Self-pay

## 2021-02-03 ENCOUNTER — Encounter (HOSPITAL_BASED_OUTPATIENT_CLINIC_OR_DEPARTMENT_OTHER): Payer: Self-pay | Admitting: Physical Therapy

## 2021-02-03 ENCOUNTER — Ambulatory Visit (HOSPITAL_BASED_OUTPATIENT_CLINIC_OR_DEPARTMENT_OTHER)
Payer: No Typology Code available for payment source | Attending: Physical Medicine & Rehabilitation | Admitting: Physical Therapy

## 2021-02-03 DIAGNOSIS — M5442 Lumbago with sciatica, left side: Secondary | ICD-10-CM | POA: Diagnosis present

## 2021-02-03 DIAGNOSIS — M6281 Muscle weakness (generalized): Secondary | ICD-10-CM | POA: Diagnosis present

## 2021-02-03 DIAGNOSIS — R262 Difficulty in walking, not elsewhere classified: Secondary | ICD-10-CM | POA: Insufficient documentation

## 2021-02-03 DIAGNOSIS — G8929 Other chronic pain: Secondary | ICD-10-CM | POA: Insufficient documentation

## 2021-02-04 NOTE — Therapy (Signed)
[facility] [address] [place], [place], [address] Phone: [phone]   Fax:  [phone]  Physical Therapy Treatment  Patient Details  Name: Johnny Clarke MRN: [id number] Date of Birth: 1976/05/07 Referring Provider (PT): Wynn Banker Victorino Sparrow, MD   Encounter Date: 02/03/2021   PT End of Session - 02/03/21 1239    Visit Number 13    Number of Visits 15    Date for PT Re-Evaluation 02/21/21    Authorization Type [place]    PT Start Time 0932    PT Stop Time 1015    PT Time Calculation (min) 43 min    Activity Tolerance Patient tolerated treatment well    Behavior During Therapy [facility] for tasks assessed/performed           Past Medical History:  Diagnosis Date  . Chronic back pain   . High cholesterol   . Mental disorder   . Migraine    Takes topamax  . PTSD (post-traumatic stress disorder)   . Sleep apnea     Past Surgical History:  Procedure Laterality Date  . HEMORRHOID SURGERY    . LUMBAR LAMINECTOMY/DECOMPRESSION MICRODISCECTOMY Left 11/11/2012   Procedure: LUMBAR LAMINECTOMY/DECOMPRESSION MICRODISCECTOMY 1 LEVEL;  Surgeon: Temple Pacini, MD;  Location: [facility];  Service: Neurosurgery;  Laterality: Left;  left five sacral one  . SPINAL FUSION    . TONSILECTOMY, ADENOIDECTOMY, BILATERAL MYRINGOTOMY AND TUBES     tubes 16 times  . TONSILLECTOMY      There were no vitals filed for this visit.   Subjective Assessment - 02/03/21 1238    Subjective Patient reports his back is doing OK. He has no complaints. He reports the pain is about the same.    Pertinent History Chronic back pain, hyperlipidemia, PTSD, sleep apnea. L5-S1 lumbar hemilaminotomy 2014; TLIF left L5-S1 Dr Charna Archer 04/07/2020    Limitations Standing;Walking    How long can you sit comfortably? Constant pain    How long can you stand comfortably? Unable to stand for too long    How long can you walk comfortably? 122 steps and L LE gets numb; uses  wheelchair for primary mobility    Patient Stated Goals More back flexibility    Currently in Pain? Yes    Pain Score 6     Pain Location Back    Pain Orientation Left    Pain Descriptors / Indicators Aching    Pain Type Chronic pain    Pain Onset More than a month ago    Pain Frequency Constant    Aggravating Factors  bending and lifting    Pain Relieving Factors pain medication    Effect of Pain on Daily Activities difficulty walking long distances    Multiple Pain Sites No                             [facility] Adult PT Treatment/Exercise - 02/04/21 0001      Lumbar Exercises: Stretches   Passive Hamstring Stretch Limitations seated 2x20 sec hold    Lower Trunk Rotation Limitations x20 each side 2 second hold    Piriformis Stretch Limitations 3x20 sec hold      Lumbar Exercises: Machines for Strengthening   Leg Press 3x10 20lbs    Other Lumbar Machine Exercise chop 10lbs 2x10; pallof press 2x10; tricpes extension 2x10: all with cuing for posture and breathing. row machine 2x10 bilateral;  Lumbar Exercises: Standing   Other Standing Lumbar Exercises 13lbs dead lift 3x10; dead lift kettle bell swing 13lbs 3x10      Lumbar Exercises: Supine   Clam 20 reps    Clam Limitations Blue    Bent Knee Raise Limitations 2x10      Manual Therapy   Manual Therapy Soft tissue mobilization;Joint mobilization    Manual therapy comments skille dpalpation of trigger points    Joint Mobilization LAD bilateral    Soft tissue mobilization Lt QL and lower thoracic & upper lumbar paraspinals using IASTYM            Trigger Point Dry Needling - 02/04/21 0001    Consent Given? Yes    Education Handout Provided Yes    Other Dry Needling 4 spots in left lumbar spine    Lumbar multifidi Response Twitch response elicited                PT Education - 02/03/21 1239    Education Details HEP and symptom mangement    Person(s) Educated Patient    Methods  Explanation;Verbal cues;Tactile cues;Demonstration    Comprehension Verbalized understanding;Returned demonstration;Verbal cues required;Tactile cues required            PT Short Term Goals - 12/27/20 1129      PT SHORT TERM GOAL #1   Title Pt will be independent with initial stretching    Time 4    Period Weeks    Status Achieved    Target Date 12/29/20      PT SHORT TERM GOAL #2   Title Pt will report pain reduction by at least 25% with lumbar ROM    Baseline 6/10 (12/01/20); increases to 7/10 with movement 4/25    Time 4    Period Weeks    Status On-going    Target Date 12/29/20      PT SHORT TERM GOAL #3   Title Pt will be able to increase lumbar flexion to at least 20% for improved reach    Baseline Currently only has 10% lumbar flexion (12/01/20)    Time 4    Period Weeks    Status Achieved    Target Date 12/29/20             PT Long Term Goals - 12/02/20 0937      PT LONG TERM GOAL #1   Title Pt will be independent with final HEP    Time 8    Period Weeks    Status New    Target Date 01/27/21      PT LONG TERM GOAL #2   Title Pt will be able to perform his own ADLs below his knee with or without reacher/grabber/long handled sponge    Baseline Wife currently assists (12/01/20)    Time 8    Period Weeks    Status New    Target Date 01/27/21      PT LONG TERM GOAL #3   Title Pt will be able to walk >122 steps without L LE numbness for improved home/limited community amb    Time 8    Period Weeks    Status New    Target Date 01/27/21                 Plan - 02/03/21 1240    Clinical Impression Statement Patient continues to do well with exercises. Therapy added kettle bell exercsies andreviewed gym exercises. he tolerated well. He has 1 more visit  scheduled. We will review HEP with likey discharge. his pain has not resloved but he has a full program and the manual therapy has not chnaged things signicantly.    Personal Factors and Comorbidities  Age;Time since onset of injury/illness/exacerbation;Comorbidity 1    Comorbidities Chronic back pain, hyperlipidemia, PTSD, sleep apnea. L5-S1 lumbar hemilaminotomy 2014; TLIF left L5-S1 Dr Charna Archer 04/07/2020    Examination-Activity Limitations Bathing;Sit;Stand;Hygiene/Grooming;Locomotion Level;Caring for Others;Bend;Transfers;Stairs;Squat    Examination-Participation Restrictions Cleaning;Community Activity;Interpersonal Relationship;Yard Work    Conservation officer, historic buildings Evolving/Moderate complexity    Clinical Decision Making Moderate    Rehab Potential Good    PT Frequency 1x / week    PT Duration 4 weeks    PT Treatment/Interventions ADLs/Self Care Home Management;Aquatic Therapy;Cryotherapy;Electrical Stimulation;Iontophoresis 4mg /ml Dexamethasone;Moist Heat;Ultrasound;DME Instruction;Gait training;Stair training;Functional mobility training;Therapeutic activities;Therapeutic exercise;Balance training;Neuromuscular re-education;Patient/family education;Manual techniques;Passive range of motion;Dry needling;Taping;Vasopneumatic Device    PT Next Visit Plan continue with core strengthneing    PT Home Exercise Plan [id number]    Consulted and Agree with Plan of Care Patient           Patient will benefit from skilled therapeutic intervention in order to improve the following deficits and impairments:  Abnormal gait,Hypomobility,Decreased activity tolerance,Decreased strength,Increased fascial restricitons  Visit Diagnosis: Chronic midline low back pain with left-sided sciatica  Muscle weakness (generalized)  Difficulty in walking, not elsewhere classified     Problem List Patient Active Problem List   Diagnosis Date Noted  . OSA (obstructive sleep apnea) 03/04/2014  . Insomnia 03/04/2014  . 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 PTSD (post-traumatic stress disorder) 03/04/2014    05/05/2014 PT DPT  02/04/2021, 8:16 AM  [facility] [address] [place], [place], [place] Phone: [phone]   Fax:  [phone]  Name: Johnny Clarke MRN: [name] Date of Birth: [birth year]

## 2021-02-08 ENCOUNTER — Ambulatory Visit (HOSPITAL_BASED_OUTPATIENT_CLINIC_OR_DEPARTMENT_OTHER): Payer: No Typology Code available for payment source | Admitting: Physical Therapy

## 2021-02-08 ENCOUNTER — Encounter (HOSPITAL_BASED_OUTPATIENT_CLINIC_OR_DEPARTMENT_OTHER): Payer: Self-pay | Admitting: Physical Therapy

## 2021-02-08 ENCOUNTER — Other Ambulatory Visit: Payer: Self-pay

## 2021-02-08 DIAGNOSIS — M6281 Muscle weakness (generalized): Secondary | ICD-10-CM

## 2021-02-08 DIAGNOSIS — M5442 Lumbago with sciatica, left side: Secondary | ICD-10-CM | POA: Diagnosis not present

## 2021-02-08 DIAGNOSIS — G8929 Other chronic pain: Secondary | ICD-10-CM

## 2021-02-08 DIAGNOSIS — R262 Difficulty in walking, not elsewhere classified: Secondary | ICD-10-CM

## 2021-02-08 NOTE — Therapy (Signed)
[name] [address] [place], [place], [address] Phone: [phone]   Fax:  [phone]  Physical Therapy Treatment  Patient Details  Name: Johnny Clarke MRN: [id number] Date of Birth: [birth year] Referring Provider (PT): Johnny Banker Victorino Sparrow, MD   Encounter Date: 02/08/2021   PT End of Session - 02/08/21 1539    Visit Number 14    Number of Visits 15    Date for PT Re-Evaluation 02/21/21    Authorization Type [place]    PT Start Time 1100    PT Stop Time 1142    PT Time Calculation (min) 42 min    Activity Tolerance Patient tolerated treatment well    Behavior During Therapy [facility] for tasks assessed/performed           Past Medical History:  Diagnosis Date  . Chronic back pain   . High cholesterol   . Mental disorder   . Migraine    Takes topamax  . PTSD (post-traumatic stress disorder)   . Sleep apnea     Past Surgical History:  Procedure Laterality Date  . HEMORRHOID SURGERY    . LUMBAR LAMINECTOMY/DECOMPRESSION MICRODISCECTOMY Left 11/11/2012   Procedure: LUMBAR LAMINECTOMY/DECOMPRESSION MICRODISCECTOMY 1 LEVEL;  Surgeon: Temple Pacini, MD;  Location: [facility];  Service: Neurosurgery;  Laterality: Left;  left five sacral one  . SPINAL FUSION    . TONSILECTOMY, ADENOIDECTOMY, BILATERAL MYRINGOTOMY AND TUBES     tubes 16 times  . TONSILLECTOMY      There were no vitals filed for this visit.   Subjective Assessment - 02/08/21 1140    Subjective Patient reports his back is about the same. He reports it is not too bad. He has been working on his stretches and exercises.    Pertinent History Chronic back pain, hyperlipidemia, PTSD, sleep apnea. L5-S1 lumbar hemilaminotomy 2014; TLIF left L5-S1 Dr Charna Archer 04/07/2020    Limitations Standing;Walking    How long can you sit comfortably? Constant pain    How long can you stand comfortably? Unable to stand for too long    How long can you walk comfortably? 122 steps  and L LE gets numb; uses wheelchair for primary mobility    Patient Stated Goals More back flexibility    Currently in Pain? Yes    Pain Score 6     Pain Location Back    Pain Orientation Left    Pain Descriptors / Indicators Aching    Pain Type Chronic pain    Pain Onset More than a month ago    Pain Frequency Constant    Aggravating Factors  bending and lifting    Pain Relieving Factors pain medications    Effect of Pain on Daily Activities difficulty walking distances                             [facility] Adult PT Treatment/Exercise - 02/08/21 0001      Self-Care   Self-Care Other Self-Care Comments    Other Self-Care Comments  how to use HEP      Lumbar Exercises: Stretches   Passive Hamstring Stretch Limitations seated 2x20 sec hold    Lower Trunk Rotation Limitations x20 each side 2 second hold    Piriformis Stretch Limitations 3x20 sec hold    Other Lumbar Stretch Exercise reviewed use of tennis ball for slef trigger point release      Lumbar Exercises: Standing   Other  Standing Lumbar Exercises scap retraction and extension x20 blue; reviewed pallow press and chop for home      Lumbar Exercises: Supine   Clam 20 reps    Clam Limitations Blue    Bent Knee Raise Limitations 2x10    Bridge 20 reps    Other Supine Lumbar Exercises Supine march 2x10reviewed progression for home      Manual Therapy   Manual Therapy Soft tissue mobilization;Joint mobilization    Manual therapy comments skille dpalpation of trigger points    Joint Mobilization LAD bilateral    Soft tissue mobilization Lt QL and lower thoracic & upper lumbar paraspinals using IASTYM            Trigger Point Dry Needling - 02/08/21 0001    Consent Given? Yes    Education Handout Provided Yes    Other Dry Needling 2 spots in lumbar multifidi and gluteals on the left    Gluteus Medius Response Twitch response elicited;Palpable increased muscle length    Lumbar multifidi Response Twitch  response elicited                PT Education - 02/08/21 1538    Education Details reviewed final HEP    Person(s) Educated Patient    Methods Explanation;Demonstration;Tactile cues;Verbal cues    Comprehension Verbalized understanding;Returned demonstration            PT Short Term Goals - 12/27/20 1129      PT SHORT TERM GOAL #1   Title Pt will be independent with initial stretching    Time 4    Period Weeks    Status Achieved    Target Date 12/29/20      PT SHORT TERM GOAL #2   Title Pt will report pain reduction by at least 25% with lumbar ROM    Baseline 6/10 (12/01/20); increases to 7/10 with movement 4/25    Time 4    Period Weeks    Status On-going    Target Date 12/29/20      PT SHORT TERM GOAL #3   Title Pt will be able to increase lumbar flexion to at least 20% for improved reach    Baseline Currently only has 10% lumbar flexion (12/01/20)    Time 4    Period Weeks    Status Achieved    Target Date 12/29/20             PT Long Term Goals - 12/02/20 0937      PT LONG TERM GOAL #1   Title Pt will be independent with final HEP    Time 8    Period Weeks    Status New    Target Date 01/27/21      PT LONG TERM GOAL #2   Title Pt will be able to perform his own ADLs below his knee with or without reacher/grabber/long handled sponge    Baseline Wife currently assists (12/01/20)    Time 8    Period Weeks    Status New    Target Date 01/27/21      PT LONG TERM GOAL #3   Title Pt will be able to walk >122 steps without L LE numbness for improved home/limited community amb    Time 8    Period Weeks    Status New    Target Date 01/27/21                 Plan - 02/08/21 1541  Clinical Impression Statement Patient has reach max benefit for therapy at this time. He has a full exercise program. he has revcieved needling and manual therapy with no significant improvement in pain. His ability to tolerate exercises and perfrom daily activity  has improved. He has been able to do light gym exercises. He continues to have spasming in his back but it appears to have iproved with manual therapy dpesite pain levels remaining consistant per VAS. See below for goal specific progress.    Personal Factors and Comorbidities Age;Time since onset of injury/illness/exacerbation;Comorbidity 1    Comorbidities Chronic back pain, hyperlipidemia, PTSD, sleep apnea. L5-S1 lumbar hemilaminotomy 2014; TLIF left L5-S1 Dr Charna Archer 04/07/2020    Examination-Activity Limitations Bathing;Sit;Stand;Hygiene/Grooming;Locomotion Level;Caring for Others;Bend;Transfers;Stairs;Squat    Examination-Participation Restrictions Cleaning;Community Activity;Interpersonal Relationship;Yard Work    Conservation officer, historic buildings Evolving/Moderate complexity    Clinical Decision Making Moderate    Rehab Potential Good    PT Frequency 1x / week    PT Duration 4 weeks    PT Treatment/Interventions ADLs/Self Care Home Management;Aquatic Therapy;Cryotherapy;Electrical Stimulation;Iontophoresis 4mg /ml Dexamethasone;Moist Heat;Ultrasound;DME Instruction;Gait training;Stair training;Functional mobility training;Therapeutic activities;Therapeutic exercise;Balance training;Neuromuscular re-education;Patient/family education;Manual techniques;Passive range of motion;Dry needling;Taping;Vasopneumatic Device    PT Next Visit Plan continue with core strengthneing    PT Home Exercise Plan [id number]    Consulted and Agree with Plan of Care Patient           Patient will benefit from skilled therapeutic intervention in order to improve the following deficits and impairments:  Abnormal gait,Hypomobility,Decreased activity tolerance,Decreased strength,Increased fascial restricitons  Visit Diagnosis: Chronic midline low back pain with left-sided sciatica  Muscle weakness (generalized)  Difficulty in walking, not elsewhere classified     Problem List Patient Active Problem List    Diagnosis Date Noted  . OSA (obstructive sleep apnea) 03/04/2014  . Insomnia 03/04/2014  . 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 PTSD (post-traumatic stress disorder) 03/04/2014    05/05/2014 PT DPT  02/08/2021, 4:52 PM  [facility] [address] [place], [place], [place] Phone: [phone]   Fax:  [phone]  Name: Johnny Clarke MRN: [name] Date of Birth: 02/19/1976

## 2021-02-10 ENCOUNTER — Other Ambulatory Visit: Payer: Self-pay

## 2021-02-10 ENCOUNTER — Encounter: Payer: Self-pay | Admitting: Physical Medicine & Rehabilitation

## 2021-02-10 ENCOUNTER — Encounter
Payer: No Typology Code available for payment source | Attending: Physical Medicine & Rehabilitation | Admitting: Physical Medicine & Rehabilitation

## 2021-02-10 VITALS — BP 100/67 | HR 76 | Temp 98.5°F | Ht 70.0 in | Wt 229.2 lb

## 2021-02-10 DIAGNOSIS — M961 Postlaminectomy syndrome, not elsewhere classified: Secondary | ICD-10-CM

## 2021-02-10 NOTE — Patient Instructions (Signed)
Keep up with Home ex program  Call me if sciatic pain comes back for repeat Epidural , need to wait at least 2 mo from now
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
## 2021-02-14 ENCOUNTER — Encounter (HOSPITAL_COMMUNITY): Payer: Self-pay

## 2021-02-14 ENCOUNTER — Other Ambulatory Visit: Payer: Self-pay

## 2021-02-14 ENCOUNTER — Ambulatory Visit (HOSPITAL_COMMUNITY)
Admission: EM | Admit: 2021-02-14 | Discharge: 2021-02-14 | Disposition: A | Discharge status: Home / Self Care | Payer: No Typology Code available for payment source | Attending: Physician Assistant | Admitting: Physician Assistant

## 2021-02-14 DIAGNOSIS — M25551 Pain in right hip: Secondary | ICD-10-CM | POA: Diagnosis not present

## 2021-02-14 DIAGNOSIS — M79604 Pain in right leg: Secondary | ICD-10-CM | POA: Diagnosis not present

## 2021-02-14 MED ORDER — PREDNISONE 10 MG (21) PO TBPK: ORAL_TABLET | ORAL | 0 refills | Status: DC

## 2021-02-14 NOTE — ED Triage Notes (Signed)
Pt reports right thigh pain x 2 days. Pain is worse when walking. Pt has not taken any meds for complaints.

## 2021-02-14 NOTE — ED Provider Notes (Addendum)
[facility]    CSN: [id number] Arrival date & time: 02/14/21  1135      History   Chief Complaint Chief Complaint  Patient presents with   Leg Pain    HPI Johnny Clarke is a 45 y.o. male.   Patient presents today with a several day history of right lateral hip pain.  He denies known injury or increase in activity prior to symptom onset; reports he woke up this way several days ago.  Pain is rated 6 on a 0-10 pain scale, localized to lateral right hip with radiation into upper leg, described as aching, worse with palpation or attempted ambulation, no alleviating factors identified.  Patient does have a history of chronic lumbar back pain but states current symptoms are not similar to previous episodes of this condition.  He denies any recent hospitalization, surgery, travel, exogenous hormone use.  Denies personal or family history of VTE event.  He denies any chest pain, shortness of breath, peripheral edema, headache, visual disturbance.  He denies any weakness, numbness, paresthesias.  He has not tried any over-the-counter medications for symptom management.  He is having difficulty with daily activities as result of symptoms but is able to bear weight and ambulate unassisted.   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 03/04/2014   PTSD (post-traumatic stress disorder) 03/04/2014    Past Surgical History:  Procedure Laterality Date   HEMORRHOID SURGERY     LUMBAR LAMINECTOMY/DECOMPRESSION MICRODISCECTOMY Left 11/11/2012   Procedure: LUMBAR LAMINECTOMY/DECOMPRESSION MICRODISCECTOMY 1 LEVEL;  Surgeon: Temple Pacini, MD;  Location: [facility];  Service:  Neurosurgery;  Laterality: Left;  left five sacral one   SPINAL FUSION     TONSILECTOMY, ADENOIDECTOMY, BILATERAL MYRINGOTOMY AND TUBES     tubes 16 times   TONSILLECTOMY         Home Medications    Prior to Admission medications   Medication Sig Start Date End Date Taking? Authorizing Provider  predniSONE (STERAPRED UNI-PAK 21 TAB) 10 MG (21) TBPK tablet As directed. 02/14/21  Yes Siani Utke K, PA-C  acetic acid 2 % otic solution INSTILL 3-5 DROPS IN BOTH EARS MONDAY, WEDNESDAY AND FRIDAY AS DIRECTED.  FOLLOW INSTILLATION OF MEDICINE WITH 15 MINUTES OF EAR CANAL OCCLUSION  WITH SILICONE EAR PLUG TO ALLOW ADEQUATE TIME FOR MEDICATION EFFECT. AS DIRECTED.  FOLLOW INSTILLATION OF MEDICINE WITH 15 MINUTES OF EAR CANAL OCCLUSION  WITH SILICONE EAR PLUG TO ALLOW ADEQUATE TIME FOR MEDICATION EFFECT. 10/08/20   [provider]  acyclovir (ZOVIRAX) 200 MG capsule TAKE FOUR CAPSULES BY MOUTH TWICE A DAY 10/20/20   [provider]  albuterol (VENTOLIN) (5 MG/ML) 0.5% NEBU Inhale 2 puffs into the lungs 2 (two) times daily.    [provider]  atorvastatin (LIPITOR) 20 MG tablet 1 tablet    [provider]  B Complex-C (B COMPLEX-VITAMIN C) CAPS TAKE 1 CAP/TAB BY MOUTH DAILY 08/07/15   [provider]  benzonatate (TESSALON) 100 MG capsule Take by mouth 3 (three) times daily as needed for cough.    [provider]  butalbital-acetaminophen-caffeine Marikay Alar,  ESGIC) 50-325-40 MG per tablet Take 2 tablets by mouth 2 (two) times daily as needed for headache.    [provider]  butalbital-aspirin-caffeine Benny Lennert) 50-325-40 MG capsule 1 capsule as needed    [provider]  cetirizine (ZYRTEC) 10 MG tablet Take 10 mg by mouth daily.    [provider]  Cholecalciferol (VITAMIN D) 50 MCG (2000 UT) tablet 1 tablet    [provider]  Cholecalciferol 50 MCG (2000 UT) CAPS Take by mouth.    [provider]   cyclobenzaprine (FLEXERIL) 10 MG tablet TAKE ONE TABLET THREE TIMES A DAY AS NEEDED FOR MUSCLE SPASMS 05/14/20   [provider]  Cyclobenzaprine HCl (FLEXERIL PO) one tablet    [provider]  dextromethorphan-guaiFENesin (ROBITUSSIN-DM) 10-100 MG/5ML liquid TAKE 1 TEASPOONFUL BY MOUTH THREE TIMES A DAY AS NEEDED 09/29/20   [provider]  diclofenac (VOLTAREN) 75 MG EC tablet Take 75 mg by mouth 2 (two) times daily.    [provider]  diclofenac Sodium (VOLTAREN) 1 % GEL Apply topically 4 (four) times daily.    [provider]  EPINEPHrine 0.3 mg/0.3 mL IJ SOAJ injection Inject into the muscle.    [provider]  Galcanezumab-gnlm (EMGALITY) 120 MG/ML SOSY Inject into the skin.    [provider]  lidocaine (LIDODERM) 5 % Place 1 patch onto the skin daily. Remove & Discard patch within 12 hours or as directed by MD    [provider]  lidocaine (XYLOCAINE) 2 % solution Use as directed 15 mLs in the mouth or throat as needed for mouth pain. TID    [provider]  Melatonin CR 3 MG TBCR Take by mouth.    [provider]  omeprazole (PRILOSEC) 40 MG capsule Take 40 mg by mouth daily.    [provider]  prazosin (MINIPRESS) 2 MG capsule Take 2 mg by mouth at bedtime.    [provider]  promethazine (PHENERGAN) 25 MG tablet Take 25 mg by mouth every 6 (six) hours as needed for nausea or vomiting.    [provider]  risperiDONE (RISPERDAL M-TABS) 2 MG disintegrating tablet Take 2 mg by mouth at bedtime. 1/2 in the am 1/2 in the pm    [provider]  sertraline (ZOLOFT) 100 MG tablet Take 100 mg by mouth daily.    [provider]  simvastatin (ZOCOR) 40 MG tablet Take 40 mg by mouth daily. Take 1/2 daily    [provider]  sulindac (CLINORIL) 150 MG tablet one tablet    [provider]  temazepam (RESTORIL) 30 MG capsule Take 30 mg by mouth at  bedtime as needed for sleep.    [provider]  tiZANidine (ZANAFLEX) 4 MG capsule Take 4 mg by mouth 3 (three) times daily.    [provider]  tizanidine (ZANAFLEX) 6 MG capsule Take 6 mg by mouth 3 (three) times daily.    [provider]  topiramate (TOPAMAX) 200 MG tablet Take 200 mg by mouth 2 (two) times daily.    [provider]    Family History Family History  Problem Relation Age of Onset   Healthy Mother     Social History Social History   Tobacco Use   Smoking status: Former    Packs/day: 0.20    Years: 3.00    Pack years: 0.60    Types: Cigarettes   Smokeless tobacco: Never  Vaping Use   Vaping Use:  Never used  Substance Use Topics   Alcohol use: Yes    Alcohol/week: 6.0 standard drinks    Types: 6 Cans of beer per week   Drug use: No     Allergies   Amoxicillin, Penicillin g, Bee venom, and Penicillins   Review of Systems Review of Systems  Constitutional:  Positive for activity change. Negative for appetite change, fatigue and fever.  Respiratory:  Negative for cough and shortness of breath.   Cardiovascular:  Negative for chest pain.  Musculoskeletal:  Positive for arthralgias and gait problem. Negative for back pain, joint swelling and myalgias.  Neurological:  Negative for dizziness, weakness, light-headedness, numbness and headaches.    Physical Exam Triage Vital Signs ED Triage Vitals  Enc Vitals Group     BP 02/14/21 1227 123/84     Pulse Rate 02/14/21 1227 68     Resp 02/14/21 1227 18     Temp 02/14/21 1227 99 F (37.2 C)     Temp Source 02/14/21 1227 Oral     SpO2 02/14/21 1227 100 %     Weight --      Height --      Head Circumference --      Peak Flow --      Pain Score 02/14/21 1221 6     Pain Loc --      Pain Edu? --      Excl. in GC? --    No data found.  Updated Vital Signs BP 123/84 (BP Location: Right Arm)   Pulse 68   Temp 99 F (37.2 C) (Oral)   Resp 18   SpO2 100%   Visual  Acuity Right Eye Distance:   Left Eye Distance:   Bilateral Distance:    Right Eye Near:   Left Eye Near:    Bilateral Near:     Physical Exam Vitals reviewed.  Constitutional:      General: He is awake.     Appearance: Normal appearance. He is normal weight. He is not ill-appearing.     Comments: Very pleasant male appears stated age in no acute distress  HENT:     Head: Normocephalic and atraumatic.  Cardiovascular:     Rate and Rhythm: Normal rate and regular rhythm.     Pulses:          Posterior tibial pulses are 2+ on the right side and 2+ on the left side.     Heart sounds: Normal heart sounds, S1 normal and S2 normal. No murmur heard. Pulmonary:     Effort: Pulmonary effort is normal.     Breath sounds: Normal breath sounds. No stridor. No wheezing, rhonchi or rales.     Comments: Clear to auscultation bilaterally Musculoskeletal:     Cervical back: No tenderness or bony tenderness.     Thoracic back: No tenderness or bony tenderness.     Lumbar back: Tenderness present. No bony tenderness.     Right hip: Tenderness present. No deformity or bony tenderness. Normal range of motion. Normal strength.     Comments: Right hip: Tenderness to palpation at lateral right hip and right upper leg.  No deformity noted.  Strength 5/5 bilateral lower extremities.  Normal active range of motion.  Normal gait.  Neurological:     Mental Status: He is alert.  Psychiatric:        Behavior: Behavior is cooperative.     UC Treatments / Results  Labs (all labs ordered are listed, but  only abnormal results are displayed) Labs Reviewed - No data to display  EKG   Radiology No results found.  Procedures Procedures (including critical care time)  Medications Ordered in UC Medications - No data to display  Initial Impression / Assessment and Plan / UC Course  I have reviewed the triage vital signs and the nursing notes.  Pertinent labs & imaging results that were available  during my care of the patient were reviewed by me and considered in my medical decision making (see chart for details).     X-ray was obtained given lack of bony tenderness or deformity on exam and atraumatic injury.  Suspect bursitis given clinical presentation.  Patient was prescribed prednisone with instructions to take additional NSAIDs with this medication due to risk of GI bleeding.  Discussed potential utility of seeing orthopedics should symptoms persist and he was given contact information for local group and encouraged to follow-up.  Discussed alarm symptoms that warrant emergent evaluation.  Strict return precautions given to which patient expressed understanding.  Final Clinical Impressions(s) / UC Diagnoses   Final diagnoses:  Right hip pain  Right leg pain     Discharge Instructions      I believe that you have a bursitis which is causing your symptoms.  I have called in prednisone taper.  While you are taking this do not take NSAIDs including aspirin, ibuprofen/Advil, naproxen/Aleve, diclofenac/Voltaren as it can cause stomach bleeding.  You can take Tylenol for breakthrough pain.  Use heat, rest, stretch for additional symptom relief.  If symptoms or not improving please follow-up with orthopedics as we discussed.  If you develop any worsening pain, swelling, redness/heat, chest pain, shortness of breath you need to be reevaluated immediately.     ED Prescriptions     Medication Sig Dispense Auth. Provider   predniSONE (STERAPRED UNI-PAK 21 TAB) 10 MG (21) TBPK tablet As directed. 21 tablet Legacy Lacivita, Noberto Retort, PA-C      PDMP not reviewed this encounter.   Jeani Hawking, PA-C 02/14/21 1302    Mya Suell, Noberto Retort, PA-C 02/14/21 1303

## 2021-02-14 NOTE — Discharge Instructions (Addendum)
I believe that you have a bursitis which is causing your symptoms.  I have called in prednisone taper.  While you are taking this do not take NSAIDs including aspirin, ibuprofen/Advil, naproxen/Aleve, diclofenac/Voltaren as it can cause stomach bleeding.  You can take Tylenol for breakthrough pain.  Use heat, rest, stretch for additional symptom relief.  If symptoms or not improving please follow-up with orthopedics as we discussed.  If you develop any worsening pain, swelling, redness/heat, chest pain, shortness of breath you need to be reevaluated immediately.

## 2021-05-15 IMAGING — DX DG CHEST 2V
2 series · 2 of 2 positions shown · non-contrast
Comparison: 03/21/2013

CLINICAL DATA: Chronic cough

EXAM:
CHEST - 2 VIEW

[chest lat]
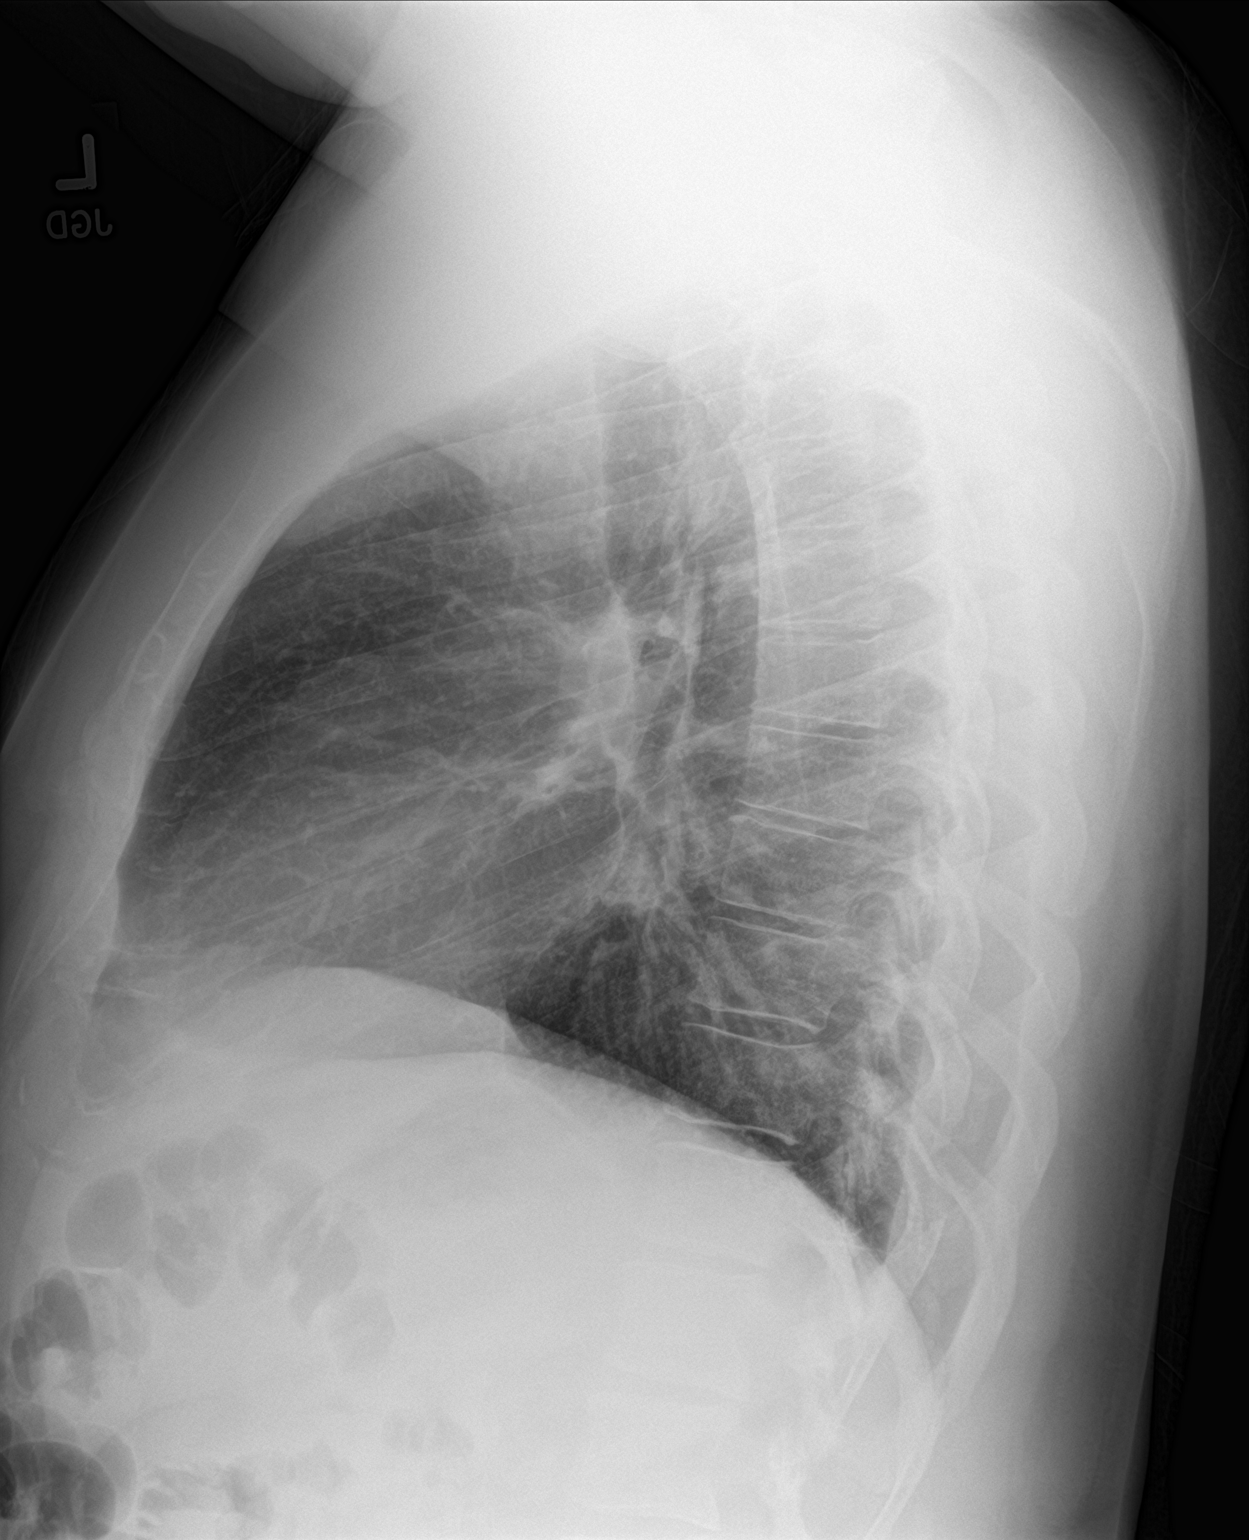

[chest pa]
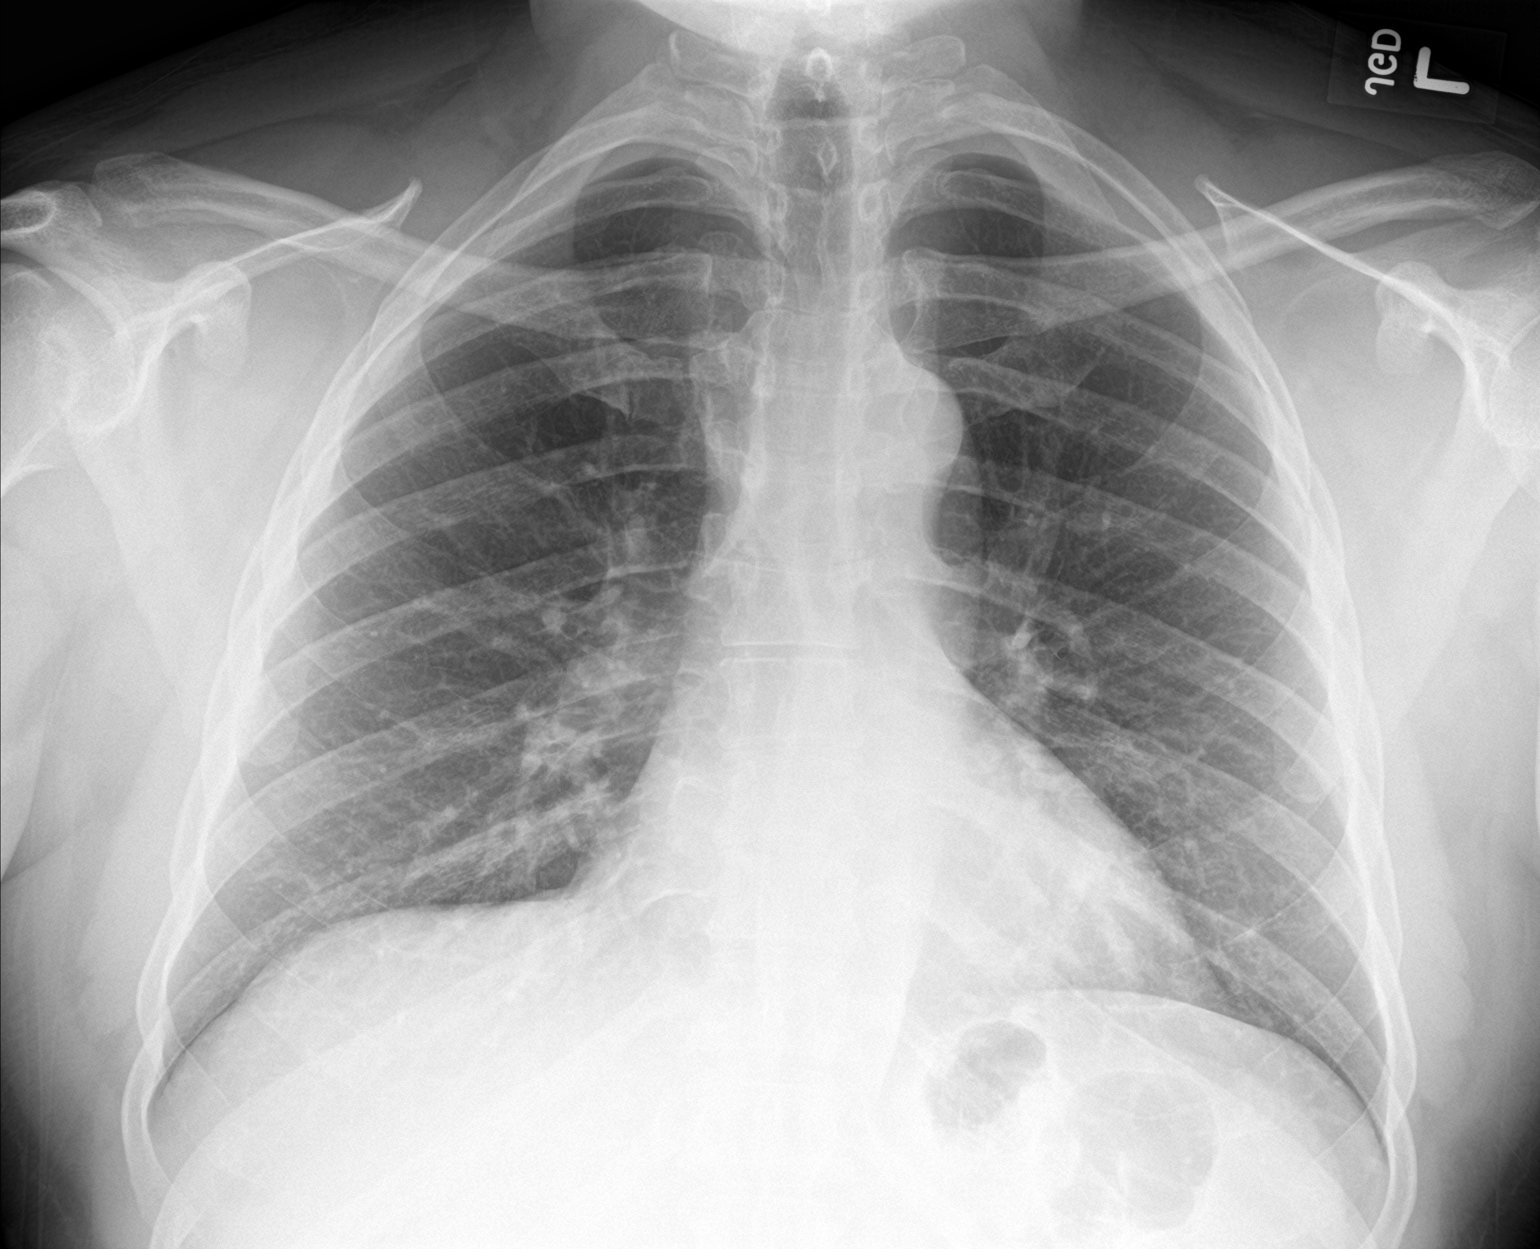

[2 of 2 positions shown; findings below may reference images not displayed]

FINDINGS: Heart and mediastinal contours are within normal limits. No focal
opacities or effusions. No acute bony abnormality.
IMPRESSION: No active cardiopulmonary disease.

## 2022-05-20 ENCOUNTER — Other Ambulatory Visit: Payer: Self-pay

## 2022-05-20 ENCOUNTER — Encounter (HOSPITAL_COMMUNITY): Payer: Self-pay | Admitting: Emergency Medicine

## 2022-05-20 ENCOUNTER — Emergency Department (HOSPITAL_COMMUNITY)
Admission: EM | Admit: 2022-05-20 | Discharge: 2022-05-20 | Discharge status: Against Medical Advice | Payer: No Typology Code available for payment source | Attending: Emergency Medicine | Admitting: Emergency Medicine

## 2022-05-20 DIAGNOSIS — Z5321 Procedure and treatment not carried out due to patient leaving prior to being seen by health care provider: Secondary | ICD-10-CM | POA: Diagnosis not present

## 2022-05-20 DIAGNOSIS — G43909 Migraine, unspecified, not intractable, without status migrainosus: Secondary | ICD-10-CM | POA: Insufficient documentation

## 2022-05-20 DIAGNOSIS — R519 Headache, unspecified: Secondary | ICD-10-CM | POA: Diagnosis present

## 2022-05-20 DIAGNOSIS — H5319 Other subjective visual disturbances: Secondary | ICD-10-CM | POA: Diagnosis not present

## 2022-05-20 NOTE — ED Notes (Signed)
Registration handed this nt patients labels and stated this patient left

## 2022-05-20 NOTE — ED Triage Notes (Signed)
Patient here with complaint of migraine headache that started three days ago, history of same. Has tried prescribed migraine rescue medications with no relief in pain. Patient is alert, oriented, and in no apparent distress at this time.

## 2022-05-20 NOTE — ED Provider Triage Note (Signed)
Emergency Medicine Provider Triage Evaluation Note  Johnny Clarke , a 46 y.o. male  was evaluated in triage.  Pt complains of left-sided headache for the past 3 days.  He has a history of migraines, takes a daily medication for this, reports his pain to feel similar to previous migraines.  Has taken his medications and has not missed a dose.  Reports persistent photopsia.  Not the worst headache of his life, did describe the onset as instantaneous.  Review of Systems  Positive: As above Negative: Numbness, weakness, tingling, vomiting, neck pain, fevers, chills  Physical Exam  There were no vitals taken for this visit. Gen:   Awake, no distress   Resp:  Normal effort  MSK:   Moves extremities without difficulty  Other:  Neck nontender, pupils equal and reactive  Medical Decision Making  Medically screening exam initiated at 5:28 PM.  Appropriate orders placed.  [name] was informed that the remainder of the evaluation will be completed by another provider, this initial triage assessment does not replace that evaluation, and the importance of remaining in the ED until their evaluation is complete.     Johnny Clarke, [place] 05/20/22 1730

## 2022-07-06 ENCOUNTER — Encounter: Payer: Self-pay | Admitting: Pulmonary Disease

## 2022-07-06 ENCOUNTER — Ambulatory Visit (INDEPENDENT_AMBULATORY_CARE_PROVIDER_SITE_OTHER): Payer: No Typology Code available for payment source | Admitting: Pulmonary Disease

## 2022-07-06 VITALS — BP 118/76 | HR 71 | Temp 98.0°F | Ht 70.0 in | Wt 228.0 lb

## 2022-07-06 DIAGNOSIS — G4733 Obstructive sleep apnea (adult) (pediatric): Secondary | ICD-10-CM

## 2022-07-06 DIAGNOSIS — F5101 Primary insomnia: Secondary | ICD-10-CM

## 2022-07-06 MED ORDER — ZOLPIDEM TARTRATE ER 12.5 MG PO TBCR: 12.5000 mg | EXTENDED_RELEASE_TABLET | Freq: Every evening | ORAL | 3 refills | Status: AC | PRN

## 2022-07-06 NOTE — Patient Instructions (Signed)
Prescription for Ambien sent to pharmacy for you  Use this in place of doxepin  Continue with Ambien if it works better than doxepin for you, however, if it does not work as good then go back to using doxepin  I will see you back in about 6 months  Continue using your CPAP on a nightly basis

## 2022-07-06 NOTE — Progress Notes (Signed)
Johnny Clarke    [id number]    [birth year]  Primary Care Physician:Mulles, Lucita Lora, MD  Referring Physician: Sherwood Gambler, MD [phone] [facility] [address] [place],  [place] [address]  Chief complaint:   Patient being seen for sleep concerns  HPI:  Patient with a history of obstructive sleep apnea-on CPAP and compliant with CPAP use Has sleep onset insomnia for which he has been on doxepin -Doxepin does help him get more sleep -Takes him about an hour to fall asleep with use of doxepin -Not able to sleep without doxepin can be taken up to 3 hours  Does try to get about 6 hours of sleep  Usual bedtime about 11 PM About 1 awakening, wakes up multiple more times without a sleep aid  Active smoker  History of hypercholesterolemia, allergies, chronic headaches, PTSD  History of back, hip and neck surgery in the past Able to walk but limited, needs a wheelchair to get around sometimes  Outpatient Encounter Medications as of 07/06/2022  Medication Sig   acetic acid 2 % otic solution INSTILL 3-5 DROPS IN BOTH EARS MONDAY, WEDNESDAY AND FRIDAY AS DIRECTED.  FOLLOW INSTILLATION OF MEDICINE WITH 15 MINUTES OF EAR CANAL OCCLUSION  WITH SILICONE EAR PLUG TO ALLOW ADEQUATE TIME FOR MEDICATION EFFECT. AS DIRECTED.  FOLLOW INSTILLATION OF MEDICINE WITH 15 MINUTES OF EAR CANAL OCCLUSION  WITH SILICONE EAR PLUG TO ALLOW ADEQUATE TIME FOR MEDICATION EFFECT.   acyclovir (ZOVIRAX) 200 MG capsule TAKE FOUR CAPSULES BY MOUTH TWICE A DAY   albuterol (VENTOLIN) (5 MG/ML) 0.5% NEBU Inhale 2 puffs into the lungs 2 (two) times daily.   atorvastatin (LIPITOR) 20 MG tablet 1 tablet   B Complex-C (B COMPLEX-VITAMIN C) CAPS TAKE 1 CAP/TAB BY MOUTH DAILY   benzonatate (TESSALON) 100 MG capsule Take by mouth 3 (three) times daily as needed for cough.   butalbital-acetaminophen-caffeine (FIORICET, ESGIC) 50-325-40 MG per tablet Take 2 tablets by mouth 2 (two) times daily as needed for headache.    butalbital-aspirin-caffeine (FIORINAL) 50-325-40 MG capsule 1 capsule as needed   cetirizine (ZYRTEC) 10 MG tablet Take 10 mg by mouth daily.   Cholecalciferol (VITAMIN D) 50 MCG (2000 UT) tablet 1 tablet   Cholecalciferol 50 MCG (2000 UT) CAPS Take by mouth.   cyclobenzaprine (FLEXERIL) 10 MG tablet TAKE ONE TABLET THREE TIMES A DAY AS NEEDED FOR MUSCLE SPASMS   Cyclobenzaprine HCl (FLEXERIL PO) one tablet   dextromethorphan-guaiFENesin (ROBITUSSIN-DM) 10-100 MG/5ML liquid TAKE 1 TEASPOONFUL BY MOUTH THREE TIMES A DAY AS NEEDED   diclofenac (VOLTAREN) 75 MG EC tablet Take 75 mg by mouth 2 (two) times daily.   diclofenac Sodium (VOLTAREN) 1 % GEL Apply topically 4 (four) times daily.   EPINEPHrine 0.3 mg/0.3 mL IJ SOAJ injection Inject into the muscle.   Galcanezumab-gnlm (EMGALITY) 120 MG/ML SOSY Inject into the skin.   lidocaine (LIDODERM) 5 % Place 1 patch onto the skin daily. Remove & Discard patch within 12 hours or as directed by MD   lidocaine (XYLOCAINE) 2 % solution Use as directed 15 mLs in the mouth or throat as needed for mouth pain. TID   Melatonin CR 3 MG TBCR Take by mouth.   omeprazole (PRILOSEC) 40 MG capsule Take 40 mg by mouth daily.   prazosin (MINIPRESS) 2 MG capsule Take 2 mg by mouth at bedtime.   predniSONE (STERAPRED UNI-PAK 21 TAB) 10 MG (21) TBPK tablet As directed.   promethazine (PHENERGAN) 25 MG  tablet Take 25 mg by mouth every 6 (six) hours as needed for nausea or vomiting.   risperiDONE (RISPERDAL M-TABS) 2 MG disintegrating tablet Take 2 mg by mouth at bedtime. 1/2 in the am 1/2 in the pm   sertraline (ZOLOFT) 100 MG tablet Take 100 mg by mouth daily.   simvastatin (ZOCOR) 40 MG tablet Take 40 mg by mouth daily. Take 1/2 daily   sulindac (CLINORIL) 150 MG tablet one tablet   temazepam (RESTORIL) 30 MG capsule Take 30 mg by mouth at bedtime as needed for sleep.   tiZANidine (ZANAFLEX) 4 MG capsule Take 4 mg by mouth 3 (three) times daily.   tizanidine  (ZANAFLEX) 6 MG capsule Take 6 mg by mouth 3 (three) times daily.   topiramate (TOPAMAX) 200 MG tablet Take 200 mg by mouth 2 (two) times daily.   No facility-administered encounter medications on file as of 07/06/2022.    Allergies as of 07/06/2022 - Review Complete 05/20/2022  Allergen Reaction Noted   Amoxicillin Anaphylaxis, Rash, and Shortness Of Breath 04/30/2014   Penicillin g Anaphylaxis and Shortness Of Breath 04/30/2014   Bee venom  02/14/2021   Penicillins  03/21/2012    Past Medical History:  Diagnosis Date   Chronic back pain    High cholesterol    Mental disorder    Migraine    Takes topamax   PTSD (post-traumatic stress disorder)    Sleep apnea     Past Surgical History:  Procedure Laterality Date   HEMORRHOID SURGERY     LUMBAR LAMINECTOMY/DECOMPRESSION MICRODISCECTOMY Left 11/11/2012   Procedure: LUMBAR LAMINECTOMY/DECOMPRESSION MICRODISCECTOMY 1 LEVEL;  Surgeon: Temple Pacini, MD;  Location: [facility];  Service: Neurosurgery;  Laterality: Left;  left five sacral one   SPINAL FUSION     TONSILECTOMY, ADENOIDECTOMY, BILATERAL MYRINGOTOMY AND TUBES     tubes 16 times   TONSILLECTOMY      Family History  Problem Relation Age of Onset   Healthy Mother     Social History   Socioeconomic History   Marital status: Married    Spouse name: Not on file   Number of children: Not on file   Years of education: Not on file   Highest education level: Not on file  Occupational History   Not on file  Tobacco Use   Smoking status: Former    Packs/day: 0.20    Years: 3.00    Total pack years: 0.60    Types: Cigarettes   Smokeless tobacco: Never  Vaping Use   Vaping Use: Never used  Substance and Sexual Activity   Alcohol use: Yes    Alcohol/week: 6.0 standard drinks of alcohol    Types: 6 Cans of beer per week   Drug use: No   Sexual activity: Not on file  Other Topics Concern   Not on file  Social History Narrative   Not on file   Social  Determinants of Health   Financial Resource Strain: Not on file  Food Insecurity: Not on file  Transportation Needs: Not on file  Physical Activity: Not on file  Stress: Not on file  Social Connections: Not on file  Intimate Partner Violence: Not on file    Review of Systems  Respiratory:  Positive for apnea.   Musculoskeletal:  Positive for arthralgias and back pain.  Psychiatric/Behavioral:  Positive for sleep disturbance.     There were no vitals filed for this visit.   Physical Exam Constitutional:  Appearance: He is obese.  HENT:     Head: Normocephalic.     Mouth/Throat:     Mouth: Mucous membranes are moist.  Cardiovascular:     Rate and Rhythm: Normal rate and regular rhythm.     Heart sounds: No murmur heard.    No friction rub.  Pulmonary:     Effort: No respiratory distress.     Breath sounds: No stridor. No wheezing or rhonchi.  Musculoskeletal:     Cervical back: No rigidity or tenderness.  Neurological:     Mental Status: He is alert.  Psychiatric:        Mood and Affect: Mood normal.      Data Reviewed: Sleep study result not available to review  Assessment:  Obstructive sleep apnea -Compliant with CPAP use  Sleep onset insomnia  PTSD  Plan/Recommendations: Continue CPAP on a nightly basis  Prescription for Ambien to be tried in place of doxepin  If Ambien not as effective, stay on doxepin  Follow-up in 6 months   Sherrilyn Rist MD [facility] 07/06/2022, 9:48 AM  CC: Borum, Jaci Standard, MD

## 2022-07-07 ENCOUNTER — Encounter: Payer: Self-pay | Admitting: Pulmonary Disease

## 2022-07-19 ENCOUNTER — Encounter: Payer: Self-pay | Admitting: Pulmonary Disease

## 2022-07-20 NOTE — Telephone Encounter (Signed)
Dr. Wynona Neat, please advise on pt's message. He has attached photos from his airview account. Thanks.

## 2022-07-20 NOTE — Telephone Encounter (Signed)
AHI is what you need to look at   It is 1.28 on the picture you sent  That means the machine is sensing about 1.28 times an hour, anything below 5 is optimal treatment. Continue using your CPAP nightly

## 2022-08-14 NOTE — Telephone Encounter (Signed)
[facility] wants to speak to nurse re: changing a scripts due to outage on original script for pt. Pls call [facility] [phone]  Med was Ambian 12.5 Mg tabs control release. Can get 6.25;  IS avail. caN dR. Send script for this.

## 2022-08-14 NOTE — Telephone Encounter (Signed)
Dr. O, please advise. 

## 2022-08-27 ENCOUNTER — Encounter (HOSPITAL_BASED_OUTPATIENT_CLINIC_OR_DEPARTMENT_OTHER): Payer: Self-pay | Admitting: Emergency Medicine

## 2022-08-27 ENCOUNTER — Emergency Department (HOSPITAL_BASED_OUTPATIENT_CLINIC_OR_DEPARTMENT_OTHER)
Admission: EM | Admit: 2022-08-27 | Discharge: 2022-08-27 | Disposition: A | Discharge status: Home / Self Care | Payer: No Typology Code available for payment source | Attending: Emergency Medicine | Admitting: Emergency Medicine

## 2022-08-27 ENCOUNTER — Emergency Department (HOSPITAL_BASED_OUTPATIENT_CLINIC_OR_DEPARTMENT_OTHER): Payer: No Typology Code available for payment source

## 2022-08-27 DIAGNOSIS — Z7982 Long term (current) use of aspirin: Secondary | ICD-10-CM | POA: Diagnosis not present

## 2022-08-27 DIAGNOSIS — R748 Abnormal levels of other serum enzymes: Secondary | ICD-10-CM | POA: Insufficient documentation

## 2022-08-27 DIAGNOSIS — R1033 Periumbilical pain: Secondary | ICD-10-CM | POA: Diagnosis present

## 2022-08-27 LAB — COMPREHENSIVE METABOLIC PANEL
ALT: 35 U/L (ref 0–44)
AST: 27 U/L (ref 15–41)
Albumin: 4.5 g/dL (ref 3.5–5.0)
Alkaline Phosphatase: 75 U/L (ref 38–126)
Anion gap: 8 (ref 5–15)
BUN: 14 mg/dL (ref 6–20)
CO2: 26 mmol/L (ref 22–32)
Calcium: 9.3 mg/dL (ref 8.9–10.3)
Chloride: 106 mmol/L (ref 98–111)
Creatinine, Ser: 1.22 mg/dL (ref 0.61–1.24)
GFR, Estimated: >60 mL/min (ref >60)
Glucose, Bld: 94 mg/dL (ref 70–99)
Potassium: 3.9 mmol/L (ref 3.5–5.1)
Sodium: 140 mmol/L (ref 135–145)
Total Bilirubin: 0.5 mg/dL (ref 0.3–1.2)
Total Protein: 7.4 g/dL (ref 6.5–8.1)

## 2022-08-27 LAB — CBC WITH DIFFERENTIAL/PLATELET
Abs Immature Granulocytes: 0.01 10*3/uL (ref 0.00–0.07)
Basophils Absolute: 0 10*3/uL (ref 0.0–0.1)
Basophils Relative: 1 %
Eosinophils Absolute: 0.4 10*3/uL (ref 0.0–0.5)
Eosinophils Relative: 8 %
HCT: 46.9 % (ref 39.0–52.0)
Hemoglobin: 16.3 g/dL (ref 13.0–17.0)
Immature Granulocytes: 0 %
Lymphocytes Relative: 61 %
Lymphs Abs: 3.1 10*3/uL (ref 0.7–4.0)
MCH: 32 pg (ref 26.0–34.0)
MCHC: 34.8 g/dL (ref 30.0–36.0)
MCV: 92.1 fL (ref 80.0–100.0)
Monocytes Absolute: 0.4 10*3/uL (ref 0.1–1.0)
Monocytes Relative: 7 %
Neutro Abs: 1.2 10*3/uL — ABNORMAL LOW (ref 1.7–7.7)
Neutrophils Relative %: 23 %
Platelets: 194 10*3/uL (ref 150–400)
RBC: 5.09 MIL/uL (ref 4.22–5.81)
RDW: 12.6 % (ref 11.5–15.5)
WBC: 5.1 10*3/uL (ref 4.0–10.5)
nRBC: 0 % (ref 0.0–0.2)

## 2022-08-27 LAB — LIPASE, BLOOD: Lipase: 83 U/L — ABNORMAL HIGH (ref 11–51)

## 2022-08-27 MED ORDER — IOHEXOL 300 MG/ML  SOLN
100.0000 mL | Freq: Once | INTRAMUSCULAR | Status: AC | PRN
  Administered 2022-08-27: 100 mL via INTRAVENOUS

## 2022-08-27 NOTE — ED Provider Notes (Signed)
[facility] EMERGENCY DEPT Provider Note   CSN: [id number] Arrival date & time: 08/27/22  0001     History  Chief complaint: Abdominal pain  Johnny Clarke is a 46 y.o. male.  The history is provided by the patient.  He has a history of hyperlipidemia, posttraumatic stress disorder and comes in complaining of pain around his bellybutton which started about 3 hours before coming to the emergency department.  Pain has subsided somewhat but is still present.  He denies nausea or vomiting.  He has been passing flatus which does not affect the pain.  He has never had pain like this before.   Home Medications Prior to Admission medications   Medication Sig Start Date End Date Taking? Authorizing Provider  acetic acid 2 % otic solution INSTILL 3-5 DROPS IN BOTH EARS MONDAY, WEDNESDAY AND FRIDAY AS DIRECTED.  FOLLOW INSTILLATION OF MEDICINE WITH 15 MINUTES OF EAR CANAL OCCLUSION  WITH SILICONE EAR PLUG TO ALLOW ADEQUATE TIME FOR MEDICATION EFFECT. AS DIRECTED.  FOLLOW INSTILLATION OF MEDICINE WITH 15 MINUTES OF EAR CANAL OCCLUSION  WITH SILICONE EAR PLUG TO ALLOW ADEQUATE TIME FOR MEDICATION EFFECT. 10/08/20   [provider]  acyclovir (ZOVIRAX) 200 MG capsule TAKE FOUR CAPSULES BY MOUTH TWICE A DAY 10/20/20   [provider]  albuterol (VENTOLIN) (5 MG/ML) 0.5% NEBU Inhale 2 puffs into the lungs 2 (two) times daily.    [provider]  atorvastatin (LIPITOR) 20 MG tablet 1 tablet    [provider]  B Complex-C (B COMPLEX-VITAMIN C) CAPS TAKE 1 CAP/TAB BY MOUTH DAILY 08/07/15   [provider]  benzonatate (TESSALON) 100 MG capsule Take by mouth 3 (three) times daily as needed for cough.    [provider]  butalbital-acetaminophen-caffeine (FIORICET, ESGIC) 50-325-40 MG per tablet Take 2 tablets by mouth 2 (two) times daily as needed for headache.    [provider]  butalbital-aspirin-caffeine Benny Lennert) 50-325-40 MG  capsule 1 capsule as needed    [provider]  cetirizine (ZYRTEC) 10 MG tablet Take 10 mg by mouth daily.    [provider]  Cholecalciferol (VITAMIN D) 50 MCG (2000 UT) tablet 1 tablet    [provider]  Cholecalciferol 50 MCG (2000 UT) CAPS Take by mouth.    [provider]  cyclobenzaprine (FLEXERIL) 10 MG tablet TAKE ONE TABLET THREE TIMES A DAY AS NEEDED FOR MUSCLE SPASMS 05/14/20   [provider]  Cyclobenzaprine HCl (FLEXERIL PO) one tablet    [provider]  dextromethorphan-guaiFENesin (ROBITUSSIN-DM) 10-100 MG/5ML liquid TAKE 1 TEASPOONFUL BY MOUTH THREE TIMES A DAY AS NEEDED 09/29/20   [provider]  diclofenac (VOLTAREN) 75 MG EC tablet Take 75 mg by mouth 2 (two) times daily.    [provider]  diclofenac Sodium (VOLTAREN) 1 % GEL Apply topically 4 (four) times daily.    [provider]  EPINEPHrine 0.3 mg/0.3 mL IJ SOAJ injection Inject into the muscle.    [provider]  Galcanezumab-gnlm (EMGALITY) 120 MG/ML SOSY Inject into the skin.    [provider]  lidocaine (LIDODERM) 5 % Place 1 patch onto the skin daily. Remove & Discard patch within 12 hours or as directed by MD    [provider]  lidocaine (XYLOCAINE) 2 % solution Use as directed 15 mLs in the mouth or throat as needed for mouth pain. TID    [provider]  Melatonin CR 3 MG TBCR Take by mouth.  [provider]  omeprazole (PRILOSEC) 40 MG capsule Take 40 mg by mouth daily.    [provider]  prazosin (MINIPRESS) 2 MG capsule Take 2 mg by mouth at bedtime.    [provider]  promethazine (PHENERGAN) 25 MG tablet Take 25 mg by mouth every 6 (six) hours as needed for nausea or vomiting.    [provider]  risperiDONE (RISPERDAL M-TABS) 2 MG disintegrating tablet Take 2 mg by mouth at bedtime. 1/2 in the am 1/2 in the pm    [provider]  sertraline  (ZOLOFT) 100 MG tablet Take 100 mg by mouth daily.    [provider]  simvastatin (ZOCOR) 40 MG tablet Take 40 mg by mouth daily. Take 1/2 daily    [provider]  sulindac (CLINORIL) 150 MG tablet one tablet    [provider]  temazepam (RESTORIL) 30 MG capsule Take 30 mg by mouth at bedtime as needed for sleep.    [provider]  tiZANidine (ZANAFLEX) 4 MG capsule Take 4 mg by mouth 3 (three) times daily.    [provider]  tizanidine (ZANAFLEX) 6 MG capsule Take 6 mg by mouth 3 (three) times daily.    [provider]  topiramate (TOPAMAX) 200 MG tablet Take 200 mg by mouth 2 (two) times daily.    [provider]  zolpidem (AMBIEN CR) 12.5 MG CR tablet Take 1 tablet (12.5 mg total) by mouth at bedtime as needed for sleep. 07/06/22   Tomma Lightning, MD      Allergies    Amoxicillin, Bee venom, Penicillin g, and Penicillins    Review of Systems   Review of Systems  All other systems reviewed and are negative.   Physical Exam Updated Vital Signs BP (!) 115/97   Pulse (!) 55   Temp 98.2 F (36.8 C) (Oral)   Resp 18   SpO2 98%  Physical Exam Vitals and nursing note reviewed.   46 year old male, resting comfortably and in no acute distress. Vital signs are significant for slightly slow heart rate and mildly elevated diastolic blood pressure. Oxygen saturation is 98%, which is normal. Head is normocephalic and atraumatic. PERRLA, EOMI. Oropharynx is clear. Neck is nontender and supple without adenopathy or JVD. Back is nontender and there is no CVA tenderness. Lungs are clear without rales, wheezes, or rhonchi. Chest is nontender. Heart has regular rate and rhythm without murmur. Abdomen is soft, flat, with moderate tenderness in the periumbilical area-mainly to the left of the umbilicus and inferior to the umbilicus.  No definite mass or hernia felt. Extremities have no cyanosis or edema, full range of motion is  present. Skin is warm and dry without rash. Neurologic: Mental status is normal, cranial nerves are intact, moves all extremities equally.  ED Results / Procedures / Treatments   Labs (all labs ordered are listed, but only abnormal results are displayed) Labs Reviewed  LIPASE, BLOOD - Abnormal; Notable for the following components:      Result Value   Lipase 83 (*)    All other components within normal limits  CBC WITH DIFFERENTIAL/PLATELET - Abnormal; Notable for the following components:   Neutro Abs 1.2 (*)    All other components within normal limits  COMPREHENSIVE METABOLIC PANEL   None  Radiology No results found.  Procedures Procedures    Medications Ordered in ED Medications - No data to display  ED Course/ Medical Decision Making/ A&P  Medical Decision Making Amount and/or Complexity of Data Reviewed Labs: ordered. Radiology: ordered.  Risk Prescription drug management.   Periumbilical pain of uncertain cause.  I suspect he may have a small umbilical hernia, possibly with some omentum trapped.  Differential diagnosis also includes diverticulitis, bowel obstruction, appendicitis, cholecystitis, pancreatitis.  I have ordered laboratory workup of CBC, comprehensive metabolic panel, lipase and they have ordered a CT of the abdomen and pelvis.  I have reviewed and interpreted his laboratory tests, and my interpretation is mild elevation of lipase of uncertain significance, normal comprehensive metabolic panel and CBC.  CT of abdomen and pelvis is pending.  Case is signed out to Dr. Charm Barges.  Final Clinical Impression(s) / ED Diagnoses Final diagnoses:  Periumbilical pain  Elevated lipase    Rx / DC Orders ED Discharge Orders     None         Dione Booze, MD 08/27/22 [phone]

## 2022-08-27 NOTE — ED Triage Notes (Signed)
Pt here from home with c/o pain at and around his navel , hurts worse on palpation, no n/v pain started approx 3 hours ago  ,

## 2022-08-27 NOTE — ED Provider Notes (Signed)
Signout from Dr. Preston Fleeting.  46 year old male complaining of pain around his bellybutton started about 3 hours prior to arrival.  Labs showing mildly elevated lipase.  No appreciable hernia on exam.  Plan is to follow-up on CT abdomen and pelvis. Physical Exam  BP 116/85   Pulse (!) 58   Temp 98.2 F (36.8 C) (Oral)   Resp 18   SpO2 100%   Physical Exam  Procedures  Procedures  ED Course / MDM    Medical Decision Making Amount and/or Complexity of Data Reviewed Labs: ordered. Radiology: ordered.  Risk Prescription drug management.   CT does not show any acute findings.  I evaluated patient and examine.  He has a soft abdomen and some tenderness around the bellybutton superficial.  Not feel a distinct mass or problem but potentially he is got a small bit of fat in a hernia that was not appreciated on CT.  Recommended NSAIDs warm compress and return instructions discussed.       Terrilee Files, MD 08/27/22 1754

## 2022-08-27 NOTE — Discharge Instructions (Signed)
You were seen in the emergency department for abdominal pain.  You had lab work and a CAT scan that did not show a definite cause of your symptoms.  You can use ibuprofen or Tylenol, warm compress to area.  Follow-up with your regular doctor.  Return to the emergency department if any fevers or worsening symptoms.

## 2022-10-20 ENCOUNTER — Ambulatory Visit (INDEPENDENT_AMBULATORY_CARE_PROVIDER_SITE_OTHER): Payer: Medicare PPO | Admitting: Primary Care

## 2022-10-20 ENCOUNTER — Encounter (INDEPENDENT_AMBULATORY_CARE_PROVIDER_SITE_OTHER): Payer: Self-pay | Admitting: Primary Care

## 2022-10-20 VITALS — BP 124/86 | HR 76 | Resp 16 | Ht 71.0 in | Wt 231.0 lb

## 2022-10-20 DIAGNOSIS — Z79899 Other long term (current) drug therapy: Secondary | ICD-10-CM

## 2022-10-20 DIAGNOSIS — E782 Mixed hyperlipidemia: Secondary | ICD-10-CM

## 2022-10-20 DIAGNOSIS — E559 Vitamin D deficiency, unspecified: Secondary | ICD-10-CM

## 2022-10-20 DIAGNOSIS — E669 Obesity, unspecified: Secondary | ICD-10-CM | POA: Diagnosis not present

## 2022-10-20 DIAGNOSIS — Z114 Encounter for screening for human immunodeficiency virus [HIV]: Secondary | ICD-10-CM | POA: Diagnosis not present

## 2022-10-20 DIAGNOSIS — Z6832 Body mass index (BMI) 32.0-32.9, adult: Secondary | ICD-10-CM

## 2022-10-20 DIAGNOSIS — F17210 Nicotine dependence, cigarettes, uncomplicated: Secondary | ICD-10-CM | POA: Diagnosis not present

## 2022-10-20 DIAGNOSIS — Z1159 Encounter for screening for other viral diseases: Secondary | ICD-10-CM

## 2022-10-20 DIAGNOSIS — Z7689 Persons encountering health services in other specified circumstances: Secondary | ICD-10-CM

## 2022-10-20 NOTE — Progress Notes (Signed)
New Patient Office Visit  Subjective    Patient ID: Johnny Clarke, male    DOB: March 07, 1976  Age: 47 y.o. MRN: [id number]  CC:  Chief Complaint  Patient presents with   New Patient (Initial Visit)   Headache   Back Pain    Lower mid    Hip Pain    Right hip     HPI Mr. Johnny Clarke is a 47 year old obese male H[facility] bilateral hearing aides who presents to establish care.  Patient has decreased lower extremity with weakness uses a motorized scooter.  He voices concerns with headaches /migraines secondary to traumatic brain injury- frequency 15 days frontal right side and if headache presents on left side he goes to [place] for injection of Demerol. Explain [facility] Chronic back pain with hx of 2 surgical interventions dx Degenerative Arthritis with degenerative disc disease lumbar spine and anterior vertebrae disc disease and chronic pain right hip secondary to degenerative joint disease. Patient has No chest pain, No abdominal pain - No Nausea, No new weakness tingling or numbness, No Cough - shortness of breath.    Outpatient Encounter Medications as of 10/20/2022  Medication Sig   acetic acid 2 % otic solution INSTILL 3-5 DROPS IN BOTH EARS MONDAY, WEDNESDAY AND FRIDAY AS DIRECTED.  FOLLOW INSTILLATION OF MEDICINE WITH 15 MINUTES OF EAR CANAL OCCLUSION  WITH SILICONE EAR PLUG TO ALLOW ADEQUATE TIME FOR MEDICATION EFFECT. AS DIRECTED.  FOLLOW INSTILLATION OF MEDICINE WITH 15 MINUTES OF EAR CANAL OCCLUSION  WITH SILICONE EAR PLUG TO ALLOW ADEQUATE TIME FOR MEDICATION EFFECT.   acyclovir (ZOVIRAX) 200 MG capsule TAKE FOUR CAPSULES BY MOUTH TWICE A DAY   albuterol (VENTOLIN) (5 MG/ML) 0.5% NEBU Inhale 2 puffs into the lungs 2 (two) times daily.   atorvastatin (LIPITOR) 20 MG tablet 1 tablet   B Complex-C (B COMPLEX-VITAMIN C) CAPS TAKE 1 CAP/TAB BY MOUTH DAILY   benzonatate (TESSALON) 100 MG capsule Take by mouth 3 (three) times daily as needed for cough.    butalbital-acetaminophen-caffeine (FIORICET, ESGIC) 50-325-40 MG per tablet Take 2 tablets by mouth 2 (two) times daily as needed for headache.   butalbital-aspirin-caffeine (FIORINAL) 50-325-40 MG capsule 1 capsule as needed   cetirizine (ZYRTEC) 10 MG tablet Take 10 mg by mouth daily.   Cholecalciferol (VITAMIN D) 50 MCG (2000 UT) tablet 1 tablet   Cholecalciferol 50 MCG (2000 UT) CAPS Take by mouth.   cyclobenzaprine (FLEXERIL) 10 MG tablet TAKE ONE TABLET THREE TIMES A DAY AS NEEDED FOR MUSCLE SPASMS   Cyclobenzaprine HCl (FLEXERIL PO) one tablet   dextromethorphan-guaiFENesin (ROBITUSSIN-DM) 10-100 MG/5ML liquid TAKE 1 TEASPOONFUL BY MOUTH THREE TIMES A DAY AS NEEDED   diclofenac (VOLTAREN) 75 MG EC tablet Take 75 mg by mouth 2 (two) times daily.   diclofenac Sodium (VOLTAREN) 1 % GEL Apply topically 4 (four) times daily.   EPINEPHrine 0.3 mg/0.3 mL IJ SOAJ injection Inject into the muscle.   Galcanezumab-gnlm (EMGALITY) 120 MG/ML SOSY Inject into the skin.   lidocaine (LIDODERM) 5 % Place 1 patch onto the skin daily. Remove & Discard patch within 12 hours or as directed by MD   lidocaine (XYLOCAINE) 2 % solution Use as directed 15 mLs in the mouth or throat as needed for mouth pain. TID   Melatonin CR 3 MG TBCR Take by mouth.   omeprazole (PRILOSEC) 40 MG capsule Take 40 mg by mouth daily.   prazosin (MINIPRESS) 2 MG capsule  Take 2 mg by mouth at bedtime.   promethazine (PHENERGAN) 25 MG tablet Take 25 mg by mouth every 6 (six) hours as needed for nausea or vomiting.   risperiDONE (RISPERDAL M-TABS) 2 MG disintegrating tablet Take 2 mg by mouth at bedtime. 1/2 in the am 1/2 in the pm   sertraline (ZOLOFT) 100 MG tablet Take 100 mg by mouth daily.   simvastatin (ZOCOR) 40 MG tablet Take 40 mg by mouth daily. Take 1/2 daily   sulindac (CLINORIL) 150 MG tablet one tablet   temazepam (RESTORIL) 30 MG capsule Take 30 mg by mouth at bedtime as needed for sleep.   tiZANidine (ZANAFLEX) 4 MG  capsule Take 4 mg by mouth 3 (three) times daily.   tizanidine (ZANAFLEX) 6 MG capsule Take 6 mg by mouth 3 (three) times daily.   topiramate (TOPAMAX) 200 MG tablet Take 200 mg by mouth 2 (two) times daily.   zolpidem (AMBIEN CR) 12.5 MG CR tablet Take 1 tablet (12.5 mg total) by mouth at bedtime as needed for sleep.   No facility-administered encounter medications on file as of 10/20/2022.    Past Medical History:  Diagnosis Date   Chronic back pain    High cholesterol    Mental disorder    Migraine    Takes topamax   PTSD (post-traumatic stress disorder)    Sleep apnea     Past Surgical History:  Procedure Laterality Date   HEMORRHOID SURGERY     LUMBAR LAMINECTOMY/DECOMPRESSION MICRODISCECTOMY Left 11/11/2012   Procedure: LUMBAR LAMINECTOMY/DECOMPRESSION MICRODISCECTOMY 1 LEVEL;  Surgeon: Charlie Pitter, MD;  Location: [facility];  Service: Neurosurgery;  Laterality: Left;  left five sacral one   SPINAL FUSION     TONSILECTOMY, ADENOIDECTOMY, BILATERAL MYRINGOTOMY AND TUBES     tubes 16 times   TONSILLECTOMY      Family History  Problem Relation Age of Onset   Healthy Mother     Social History   Socioeconomic History   Marital status: Married    Spouse name: Not on file   Number of children: Not on file   Years of education: Not on file   Highest education level: Not on file  Occupational History   Not on file  Tobacco Use   Smoking status: Some Days    Packs/day: 0.20    Years: 3.00    Total pack years: 0.60    Types: Cigarettes   Smokeless tobacco: Never   Tobacco comments:    8 cigs per day 07/06/22.   Vaping Use   Vaping Use: Never used  Substance and Sexual Activity   Alcohol use: Yes    Alcohol/week: 6.0 standard drinks of alcohol    Types: 6 Cans of beer per week   Drug use: No   Sexual activity: Not on file  Other Topics Concern   Not on file  Social History Narrative   Not on file   Social Determinants of Health   Financial Resource  Strain: Not on file  Food Insecurity: Not on file  Transportation Needs: Not on file  Physical Activity: Not on file  Stress: Not on file  Social Connections: Not on file  Intimate Partner Violence: Not on file    ROS Comprehensive ROS Pertinent positive and negative noted in HPI      Objective    Blood Pressure 124/86 (BP Location: Right Arm, Patient Position: Sitting, Cuff Size: Large)   Pulse 76   Respiration 16   Height  $5' 11"T$  (1.803 m)   Weight 231 lb (104.8 kg)   Oxygen Saturation 97%   Body Mass Index 32.22 kg/m  General: No apparent distress. Eyes: Extraocular eye movements intact, pupils equal and round. Neck: Supple, trachea midline. Thyroid: No enlargement, mobile without fixation, no tenderness. Cardiovascular: Regular rhythm and rate, no murmur, normal radial pulses. Respiratory: Normal respiratory effort, clear to auscultation. Gastrointestinal: Normal pitch active bowel sounds, nontender abdomen without distention or appreciable hepatomegaly. Musculoskeletal: Normal muscle tone, no tenderness on palpation of tibia, no excessive thoracic kyphosis. Skin: Appropriate warmth, no visible rash. Mental status: Alert, conversant, speech clear, thought logical, appropriate mood and affect, no hallucinations or delusions evident. Hematologic/lymphatic: No cervical adenopathy, no visible ecchymoses.  Assessment & Plan:  Johnny Clarke was seen today for new patient (initial visit), headache, back pain and hip pain.  Diagnoses and all orders for this visit:  Encounter to establish care  Mixed hyperlipidemia  Healthy lifestyle diet of fruits vegetables fish nuts whole grains and low saturated fat . Foods high in cholesterol or liver, fatty meats,cheese, butter avocados, nuts and seeds, chocolate and fried foods. On atorvastatin  -     Lipid Panel  Medication management -     CMP14+EGFR -     CBC with Differential  Vitamin D deficiency On supplement  -     Vitamin D,  25-hydroxy  Encounter for HCV screening test for low risk patient -     HCV Ab w Reflex to Quant PCR  Encounter for screening for HIV -     HIV Antibody (routine testing w rflx)  Class 1 obesity with body mass index (BMI) of 32.0 to 32.9 in adult, unspecified obesity type, unspecified whether serious comorbidity present Obesity is 30-39 indicating an excess in caloric intake or underlining conditions. This may lead to other co-morbidities. Educated on lifestyle modifications of diet and exercise which may reduce obesity.   -     Lipid Panel  This note has been created with Surveyor, quantity. Any transcriptional errors are unintentional.   Kerin Perna, NP 10/20/2022, 9:30 AM

## 2022-10-20 NOTE — Patient Instructions (Signed)
Calorie Counting for Weight Loss Calories are units of energy. Your body needs a certain number of calories from food to keep going throughout the day. When you eat or drink more calories than your body needs, your body stores the extra calories mostly as fat. When you eat or drink fewer calories than your body needs, your body burns fat to get the energy it needs. Calorie counting means keeping track of how many calories you eat and drink each day. Calorie counting can be helpful if you need to lose weight. If you eat fewer calories than your body needs, you should lose weight. Ask your health care provider what a healthy weight is for you. For calorie counting to work, you will need to eat the right number of calories each day to lose a healthy amount of weight per week. A dietitian can help you figure out how many calories you need in a day and will suggest ways to reach your calorie goal. A healthy amount of weight to lose each week is usually 1-2 lb (0.5-0.9 kg). This usually means that your daily calorie intake should be reduced by 500-750 calories. Eating 1,200-1,500 calories a day can help most women lose weight. Eating 1,500-1,800 calories a day can help most men lose weight. What do I need to know about calorie counting? Work with your health care provider or dietitian to determine how many calories you should get each day. To meet your daily calorie goal, you will need to: Find out how many calories are in each food that you would like to eat. Try to do this before you eat. Decide how much of the food you plan to eat. Keep a food log. Do this by writing down what you ate and how many calories it had. To successfully lose weight, it is important to balance calorie counting with a healthy lifestyle that includes regular activity. Where do I find calorie information?  The number of calories in a food can be found on a Nutrition Facts label. If a food does not have a Nutrition Facts label, try  to look up the calories online or ask your dietitian for help. Remember that calories are listed per serving. If you choose to have more than one serving of a food, you will have to multiply the calories per serving by the number of servings you plan to eat. For example, the label on a package of bread might say that a serving size is 1 slice and that there are 90 calories in a serving. If you eat 1 slice, you will have eaten 90 calories. If you eat 2 slices, you will have eaten 180 calories. How do I keep a food log? After each time that you eat, record the following in your food log as soon as possible: What you ate. Be sure to include toppings, sauces, and other extras on the food. How much you ate. This can be measured in cups, ounces, or number of items. How many calories were in each food and drink. The total number of calories in the food you ate. Keep your food log near you, such as in a pocket-sized notebook or on an app or website on your mobile phone. Some programs will calculate calories for you and show you how many calories you have left to meet your daily goal. What are some portion-control tips? Know how many calories are in a serving. This will help you know how many servings you can have of a certain   food. Use a measuring cup to measure serving sizes. You could also try weighing out portions on a kitchen scale. With time, you will be able to estimate serving sizes for some foods. Take time to put servings of different foods on your favorite plates or in your favorite bowls and cups so you know what a serving looks like. Try not to eat straight from a food's packaging, such as from a bag or box. Eating straight from the package makes it hard to see how much you are eating and can lead to overeating. Put the amount you would like to eat in a cup or on a plate to make sure you are eating the right portion. Use smaller plates, glasses, and bowls for smaller portions and to prevent  overeating. Try not to multitask. For example, avoid watching TV or using your computer while eating. If it is time to eat, sit down at a table and enjoy your food. This will help you recognize when you are full. It will also help you be more mindful of what and how much you are eating. What are tips for following this plan? Reading food labels Check the calorie count compared with the serving size. The serving size may be smaller than what you are used to eating. Check the source of the calories. Try to choose foods that are high in protein, fiber, and vitamins, and low in saturated fat, trans fat, and sodium. Shopping Read nutrition labels while you shop. This will help you make healthy decisions about which foods to buy. Pay attention to nutrition labels for low-fat or fat-free foods. These foods sometimes have the same number of calories or more calories than the full-fat versions. They also often have added sugar, starch, or salt to make up for flavor that was removed with the fat. Make a grocery list of lower-calorie foods and stick to it. Cooking Try to cook your favorite foods in a healthier way. For example, try baking instead of frying. Use low-fat dairy products. Meal planning Use more fruits and vegetables. One-half of your plate should be fruits and vegetables. Include lean proteins, such as chicken, turkey, and fish. Lifestyle Each week, aim to do one of the following: 150 minutes of moderate exercise, such as walking. 75 minutes of vigorous exercise, such as running. General information Know how many calories are in the foods you eat most often. This will help you calculate calorie counts faster. Find a way of tracking calories that works for you. Get creative. Try different apps or programs if writing down calories does not work for you. What foods should I eat?  Eat nutritious foods. It is better to have a nutritious, high-calorie food, such as an avocado, than a food with  few nutrients, such as a bag of potato chips. Use your calories on foods and drinks that will fill you up and will not leave you hungry soon after eating. Examples of foods that fill you up are nuts and nut butters, vegetables, lean proteins, and high-fiber foods such as whole grains. High-fiber foods are foods with more than 5 g of fiber per serving. Pay attention to calories in drinks. Low-calorie drinks include water and unsweetened drinks. The items listed above may not be a complete list of foods and beverages you can eat. Contact a dietitian for more information. What foods should I limit? Limit foods or drinks that are not good sources of vitamins, minerals, or protein or that are high in unhealthy fats. These   include: Candy. Other sweets. Sodas, specialty coffee drinks, alcohol, and juice. The items listed above may not be a complete list of foods and beverages you should avoid. Contact a dietitian for more information. How do I count calories when eating out? Pay attention to portions. Often, portions are much larger when eating out. Try these tips to keep portions smaller: Consider sharing a meal instead of getting your own. If you get your own meal, eat only half of it. Before you start eating, ask for a container and put half of your meal into it. When available, consider ordering smaller portions from the menu instead of full portions. Pay attention to your food and drink choices. Knowing the way food is cooked and what is included with the meal can help you eat fewer calories. If calories are listed on the menu, choose the lower-calorie options. Choose dishes that include vegetables, fruits, whole grains, low-fat dairy products, and lean proteins. Choose items that are boiled, broiled, grilled, or steamed. Avoid items that are buttered, battered, fried, or served with cream sauce. Items labeled as crispy are usually fried, unless stated otherwise. Choose water, low-fat milk,  unsweetened iced tea, or other drinks without added sugar. If you want an alcoholic beverage, choose a lower-calorie option, such as a glass of wine or light beer. Ask for dressings, sauces, and syrups on the side. These are usually high in calories, so you should limit the amount you eat. If you want a salad, choose a garden salad and ask for grilled meats. Avoid extra toppings such as bacon, cheese, or fried items. Ask for the dressing on the side, or ask for olive oil and vinegar or lemon to use as dressing. Estimate how many servings of a food you are given. Knowing serving sizes will help you be aware of how much food you are eating at restaurants. Where to find more information Centers for Disease Control and Prevention: www.cdc.gov [place] Department of Agriculture: myplate.gov Summary Calorie counting means keeping track of how many calories you eat and drink each day. If you eat fewer calories than your body needs, you should lose weight. A healthy amount of weight to lose per week is usually 1-2 lb (0.5-0.9 kg). This usually means reducing your daily calorie intake by 500-750 calories. The number of calories in a food can be found on a Nutrition Facts label. If a food does not have a Nutrition Facts label, try to look up the calories online or ask your dietitian for help. Use smaller plates, glasses, and bowls for smaller portions and to prevent overeating. Use your calories on foods and drinks that will fill you up and not leave you hungry shortly after a meal. This information is not intended to replace advice given to you by your health care provider. Make sure you discuss any questions you have with your health care provider. Document Revised: 10/02/2019 Document Reviewed: 10/02/2019 [employer]vier Patient Education  2023 [employer]  

## 2022-10-21 LAB — CMP14+EGFR
ALT: 53 IU/L — ABNORMAL HIGH (ref 0–44)
AST: 43 IU/L — ABNORMAL HIGH (ref 0–40)
Albumin/Globulin Ratio: 1.9 (ref 1.2–2.2)
Albumin: 5 g/dL (ref 4.1–5.1)
Alkaline Phosphatase: 114 IU/L (ref 44–121)
BUN/Creatinine Ratio: 11 (ref 9–20)
BUN: 11 mg/dL (ref 6–24)
Bilirubin Total: 0.4 mg/dL (ref 0.0–1.2)
CO2: 20 mmol/L (ref 20–29)
Calcium: 9.8 mg/dL (ref 8.7–10.2)
Chloride: 106 mmol/L (ref 96–106)
Creatinine, Ser: 1 mg/dL (ref 0.76–1.27)
Globulin, Total: 2.6 g/dL (ref 1.5–4.5)
Glucose: 94 mg/dL (ref 70–99)
Potassium: 4.1 mmol/L (ref 3.5–5.2)
Sodium: 142 mmol/L (ref 134–144)
Total Protein: 7.6 g/dL (ref 6.0–8.5)
eGFR: 95 mL/min/{1.73_m2} (ref >59)

## 2022-10-21 LAB — LIPID PANEL
Chol/HDL Ratio: 3 ratio (ref 0.0–5.0)
Cholesterol, Total: 170 mg/dL (ref 100–199)
HDL: 57 mg/dL (ref >39)
LDL Chol Calc (NIH): 99 mg/dL (ref 0–99)
Triglycerides: 74 mg/dL (ref 0–149)
VLDL Cholesterol Cal: 14 mg/dL (ref 5–40)

## 2022-10-21 LAB — CBC WITH DIFFERENTIAL/PLATELET
Basophils Absolute: 0 10*3/uL (ref 0.0–0.2)
Basos: 1 %
EOS (ABSOLUTE): 0.3 10*3/uL (ref 0.0–0.4)
Eos: 8 %
Hematocrit: 51 % (ref 37.5–51.0)
Hemoglobin: 17.7 g/dL (ref 13.0–17.7)
Immature Grans (Abs): 0 10*3/uL (ref 0.0–0.1)
Immature Granulocytes: 0 %
Lymphocytes Absolute: 2.1 10*3/uL (ref 0.7–3.1)
Lymphs: 53 %
MCH: 31.3 pg (ref 26.6–33.0)
MCHC: 34.7 g/dL (ref 31.5–35.7)
MCV: 90 fL (ref 79–97)
Monocytes Absolute: 0.3 10*3/uL (ref 0.1–0.9)
Monocytes: 7 %
Neutrophils Absolute: 1.2 10*3/uL — ABNORMAL LOW (ref 1.4–7.0)
Neutrophils: 31 %
Platelets: 189 10*3/uL (ref 150–450)
RBC: 5.65 x10E6/uL (ref 4.14–5.80)
RDW: 12.5 % (ref 11.6–15.4)
WBC: 4 10*3/uL (ref 3.4–10.8)

## 2022-10-21 LAB — HCV INTERPRETATION

## 2022-10-21 LAB — HCV AB W REFLEX TO QUANT PCR: HCV Ab: NONREACTIVE

## 2022-10-21 LAB — VITAMIN D 25 HYDROXY (VIT D DEFICIENCY, FRACTURES): Vit D, 25-Hydroxy: 55 ng/mL (ref 30.0–100.0)

## 2022-10-21 LAB — HIV ANTIBODY (ROUTINE TESTING W REFLEX): HIV Screen 4th Generation wRfx: NONREACTIVE

## 2022-10-30 ENCOUNTER — Encounter (INDEPENDENT_AMBULATORY_CARE_PROVIDER_SITE_OTHER): Payer: No Typology Code available for payment source

## 2022-10-31 ENCOUNTER — Ambulatory Visit (INDEPENDENT_AMBULATORY_CARE_PROVIDER_SITE_OTHER): Payer: No Typology Code available for payment source

## 2023-05-01 ENCOUNTER — Ambulatory Visit (INDEPENDENT_AMBULATORY_CARE_PROVIDER_SITE_OTHER): Payer: No Typology Code available for payment source | Admitting: Pulmonary Disease

## 2023-05-01 ENCOUNTER — Encounter: Payer: Self-pay | Admitting: Pulmonary Disease

## 2023-05-01 VITALS — BP 122/72 | HR 86 | Temp 98.6°F | Ht 70.0 in | Wt 233.0 lb

## 2023-05-01 DIAGNOSIS — F5101 Primary insomnia: Secondary | ICD-10-CM | POA: Diagnosis not present

## 2023-05-01 DIAGNOSIS — G4733 Obstructive sleep apnea (adult) (pediatric): Secondary | ICD-10-CM

## 2023-05-01 NOTE — Progress Notes (Signed)
Johnny Clarke    [id number]    29-Nov-1975  Primary Care Physician:[facility]  Referring Physician: [facility] [address] [place],  [place] [address]  Chief complaint:   Patient being seen for sleep concerns  HPI:  Patient with a history of obstructive sleep apnea-on CPAP and compliant with CPAP use Has sleep onset insomnia for which he has been on doxepin Has been using doxepin and Ambien -Doxepin does help him get more sleep -Takes him about an hour to fall asleep with use of doxepin -Not able to sleep without doxepin can be taken up to 3 hours  Does try to get about 6 hours of sleep  Usual bedtime about 11 PM About 1 awakening, wakes up multiple more times without a sleep aid  Active smoker  History of hypercholesterolemia, allergies, chronic headaches, PTSD  History of back, hip and neck surgery in the past Able to walk but limited, needs a wheelchair to get around sometimes  Outpatient Encounter Medications as of 05/01/2023  Medication Sig   acetic acid 2 % otic solution INSTILL 3-5 DROPS IN BOTH EARS MONDAY, WEDNESDAY AND FRIDAY AS DIRECTED.  FOLLOW INSTILLATION OF MEDICINE WITH 15 MINUTES OF EAR CANAL OCCLUSION  WITH SILICONE EAR PLUG TO ALLOW ADEQUATE TIME FOR MEDICATION EFFECT. AS DIRECTED.  FOLLOW INSTILLATION OF MEDICINE WITH 15 MINUTES OF EAR CANAL OCCLUSION  WITH SILICONE EAR PLUG TO ALLOW ADEQUATE TIME FOR MEDICATION EFFECT.   acyclovir (ZOVIRAX) 200 MG capsule TAKE FOUR CAPSULES BY MOUTH TWICE A DAY   albuterol (VENTOLIN) (5 MG/ML) 0.5% NEBU Inhale 2 puffs into the lungs 2 (two) times daily.   atorvastatin (LIPITOR) 20 MG tablet 1 tablet   B Complex-C (B COMPLEX-VITAMIN C) CAPS TAKE 1 CAP/TAB BY MOUTH DAILY   benzonatate (TESSALON) 100 MG capsule Take by mouth 3 (three) times daily as needed for cough.   butalbital-acetaminophen-caffeine (FIORICET, ESGIC) 50-325-40 MG per tablet Take 2 tablets by mouth 2 (two) times daily as  needed for headache.   butalbital-aspirin-caffeine (FIORINAL) 50-325-40 MG capsule 1 capsule as needed   cetirizine (ZYRTEC) 10 MG tablet Take 10 mg by mouth daily.   Cholecalciferol (VITAMIN D) 50 MCG (2000 UT) tablet 1 tablet   Cholecalciferol 50 MCG (2000 UT) CAPS Take by mouth.   cyclobenzaprine (FLEXERIL) 10 MG tablet TAKE ONE TABLET THREE TIMES A DAY AS NEEDED FOR MUSCLE SPASMS   Cyclobenzaprine HCl (FLEXERIL PO) one tablet   dextromethorphan-guaiFENesin (ROBITUSSIN-DM) 10-100 MG/5ML liquid TAKE 1 TEASPOONFUL BY MOUTH THREE TIMES A DAY AS NEEDED   diclofenac (VOLTAREN) 75 MG EC tablet Take 75 mg by mouth 2 (two) times daily.   diclofenac Sodium (VOLTAREN) 1 % GEL Apply topically 4 (four) times daily.   EPINEPHrine 0.3 mg/0.3 mL IJ SOAJ injection Inject into the muscle.   Galcanezumab-gnlm (EMGALITY) 120 MG/ML SOSY Inject into the skin.   lidocaine (LIDODERM) 5 % Place 1 patch onto the skin daily. Remove & Discard patch within 12 hours or as directed by MD   lidocaine (XYLOCAINE) 2 % solution Use as directed 15 mLs in the mouth or throat as needed for mouth pain. TID   Melatonin CR 3 MG TBCR Take by mouth.   omeprazole (PRILOSEC) 40 MG capsule Take 40 mg by mouth daily.   prazosin (MINIPRESS) 2 MG capsule Take 2 mg by mouth at bedtime.   promethazine (PHENERGAN) 25 MG tablet Take 25 mg by mouth every 6 (six) hours  as needed for nausea or vomiting.   risperiDONE (RISPERDAL M-TABS) 2 MG disintegrating tablet Take 2 mg by mouth at bedtime. 1/2 in the am 1/2 in the pm   sertraline (ZOLOFT) 100 MG tablet Take 100 mg by mouth daily.   simvastatin (ZOCOR) 40 MG tablet Take 40 mg by mouth daily. Take 1/2 daily   sulindac (CLINORIL) 150 MG tablet one tablet   temazepam (RESTORIL) 30 MG capsule Take 30 mg by mouth at bedtime as needed for sleep.   tiZANidine (ZANAFLEX) 4 MG capsule Take 4 mg by mouth 3 (three) times daily.   tizanidine (ZANAFLEX) 6 MG capsule Take 6 mg by mouth 3 (three) times  daily.   topiramate (TOPAMAX) 200 MG tablet Take 200 mg by mouth 2 (two) times daily.   zolpidem (AMBIEN CR) 12.5 MG CR tablet Take 1 tablet (12.5 mg total) by mouth at bedtime as needed for sleep.   No facility-administered encounter medications on file as of 05/01/2023.    Allergies as of 05/01/2023 - Review Complete 10/20/2022  Allergen Reaction Noted   Amoxicillin Anaphylaxis, Rash, and Shortness Of Breath 04/30/2014   Bee venom Anaphylaxis 02/14/2021   Penicillin g Anaphylaxis and Shortness Of Breath 04/30/2014   Penicillins Anaphylaxis 03/21/2012    Past Medical History:  Diagnosis Date   Chronic back pain    High cholesterol    Mental disorder    Migraine    Takes topamax   PTSD (post-traumatic stress disorder)    Sleep apnea     Past Surgical History:  Procedure Laterality Date   HEMORRHOID SURGERY     LUMBAR LAMINECTOMY/DECOMPRESSION MICRODISCECTOMY Left 11/11/2012   Procedure: LUMBAR LAMINECTOMY/DECOMPRESSION MICRODISCECTOMY 1 LEVEL;  Surgeon: Temple Pacini, MD;  Location: [facility];  Service: Neurosurgery;  Laterality: Left;  left five sacral one   SPINAL FUSION     TONSILECTOMY, ADENOIDECTOMY, BILATERAL MYRINGOTOMY AND TUBES     tubes 16 times   TONSILLECTOMY      Family History  Problem Relation Age of Onset   Healthy Mother     Social History   Socioeconomic History   Marital status: Married    Spouse name: Not on file   Number of children: Not on file   Years of education: Not on file   Highest education level: Not on file  Occupational History   Not on file  Tobacco Use   Smoking status: Some Days    Current packs/day: 0.20    Average packs/day: 0.2 packs/day for 3.0 years (0.6 ttl pk-yrs)    Types: Cigarettes   Smokeless tobacco: Never   Tobacco comments:    8 cigs per day 07/06/22.   Vaping Use   Vaping status: Never Used  Substance and Sexual Activity   Alcohol use: Yes    Alcohol/week: 6.0 standard drinks of alcohol    Types: 6 Cans  of beer per week   Drug use: No   Sexual activity: Not on file  Other Topics Concern   Not on file  Social History Narrative   Not on file   Social Determinants of Health   Financial Resource Strain: Not on file  Food Insecurity: Not on file  Transportation Needs: Not on file  Physical Activity: Not on file  Stress: Not on file  Social Connections: Not on file  Intimate Partner Violence: Not on file    Review of Systems  Respiratory:  Positive for apnea.   Musculoskeletal:  Positive for arthralgias and back  pain.  Psychiatric/Behavioral:  Positive for sleep disturbance.     There were no vitals filed for this visit.   Physical Exam Constitutional:      Appearance: He is obese.  HENT:     Head: Normocephalic.     Mouth/Throat:     Mouth: Mucous membranes are moist.  Cardiovascular:     Rate and Rhythm: Normal rate and regular rhythm.     Heart sounds: No murmur heard.    No friction rub.  Pulmonary:     Effort: No respiratory distress.     Breath sounds: No stridor. No wheezing or rhonchi.  Musculoskeletal:     Cervical back: No rigidity or tenderness.  Neurological:     Mental Status: He is alert.  Psychiatric:        Mood and Affect: Mood normal.    Data Reviewed: Sleep study result not available to review  Download from his phone app showed residual AHI 1.66 with average hours of sleep of 5 hours 34 minutes  Assessment:  Obstructive sleep apnea -Compliant with CPAP use  Sleep onset insomnia -This appears better with combination of Ambien and doxepin -A single agent did not work for him  PTSD  Plan/Recommendations: Continue CPAP on a nightly basis  Has been using doxepin with Ambien to get enough sleep Appears to be getting enough sleep at present  Recently had a will bed may help with snoring, encourage side sleeping  Follow-up in 6 months   Virl Diamond MD [facility] 05/01/2023, 9:26 AM  CC: [facility]

## 2023-05-01 NOTE — Patient Instructions (Signed)
I will see you back in about 6 months  Continue using Ambien and doxepin  Make sure you are getting good enough hours of sleep  Call us with significant concerns  Raising the head of the bed by about 15 to 20 degrees may help  Trying to sleep on your side rather than on your back may also help the snoring  Call us with significant concerns

## 2023-06-04 ENCOUNTER — Encounter (INDEPENDENT_AMBULATORY_CARE_PROVIDER_SITE_OTHER): Payer: Self-pay

## 2023-06-04 ENCOUNTER — Ambulatory Visit (INDEPENDENT_AMBULATORY_CARE_PROVIDER_SITE_OTHER): Payer: No Typology Code available for payment source

## 2023-06-04 VITALS — Ht 70.0 in | Wt 232.0 lb

## 2023-06-04 DIAGNOSIS — Z0000 Encounter for general adult medical examination without abnormal findings: Secondary | ICD-10-CM | POA: Diagnosis not present

## 2023-06-04 NOTE — Progress Notes (Signed)
 Because this visit was a virtual/telehealth visit,  certain criteria was not obtained, such a blood pressure, CBG if applicable, and timed get up and go. Any medications not marked as "taking" were not mentioned during the medication reconciliation part of the visit. Any vitals not documented were not able to be obtained due to this being a telehealth visit or patient was unable to self-report a recent blood pressure reading due to a lack of equipment at home via telehealth. Vitals that have been documented are verbally provided by the patient.   Subjective:   Johnny Clarke is a 47 y.o. male who presents for an Initial Medicare Annual Wellness Visit.  Visit Complete: Virtual  I connected with  Johnny Clarke on 06/04/23 by a audio enabled telemedicine application and verified that I am speaking with the correct person using two identifiers.  Patient Location: Home  Provider Location: Home Office  I discussed the limitations of evaluation and management by telemedicine. The patient expressed understanding and agreed to proceed.  Patient Medicare AWV questionnaire was completed by the patient on n/a; I have confirmed that all information answered by patient is correct and no changes since this date.  Cardiac Risk Factors include: dyslipidemia;hypertension;obesity (BMI >30kg/m2);sedentary lifestyle;smoking/ tobacco exposure;male gender     Objective:    Today's Vitals   06/04/23 1129 06/04/23 1130  Weight: 232 lb (105.2 kg)   Height: 5\' 10"  (1.778 m)   PainSc:  6    Body mass index is 33.29 kg/m.     06/04/2023   11:38 AM 12/01/2020    2:44 PM 11/23/2014   10:27 AM 04/05/2013    2:12 PM 11/11/2012    6:44 AM 03/21/2012    8:01 PM  Advanced Directives  Does Patient Have a Medical Advance Directive? Yes Yes No  Patient does not have advance directive   Type of Advance Directive Healthcare Power of [place];Living will Healthcare Power of [place];Living will      Does patient want  to make changes to medical advance directive?  No - Patient declined      Copy of Healthcare Power of Attorney in Chart? No - copy requested       Would patient like information on creating a medical advance directive?   No - patient declined information     Pre-existing out of facility DNR order (yellow form or pink MOST form)           Information is confidential and restricted. Go to Review Flowsheets to unlock data.    Current Medications (verified) Outpatient Encounter Medications as of 06/04/2023  Medication Sig   acetic acid 2 % otic solution INSTILL 3-5 DROPS IN BOTH EARS MONDAY, WEDNESDAY AND FRIDAY AS DIRECTED.  FOLLOW INSTILLATION OF MEDICINE WITH 15 MINUTES OF EAR CANAL OCCLUSION  WITH SILICONE EAR PLUG TO ALLOW ADEQUATE TIME FOR MEDICATION EFFECT. AS DIRECTED.  FOLLOW INSTILLATION OF MEDICINE WITH 15 MINUTES OF EAR CANAL OCCLUSION  WITH SILICONE EAR PLUG TO ALLOW ADEQUATE TIME FOR MEDICATION EFFECT.   acyclovir (ZOVIRAX) 200 MG capsule TAKE FOUR CAPSULES BY MOUTH TWICE A DAY   albuterol (VENTOLIN) (5 MG/ML) 0.5% NEBU Inhale 2 puffs into the lungs 2 (two) times daily.   atorvastatin (LIPITOR) 20 MG tablet 1 tablet   B Complex-C (B COMPLEX-VITAMIN C) CAPS TAKE 1 CAP/TAB BY MOUTH DAILY   benzonatate (TESSALON) 100 MG capsule Take by mouth 3 (three) times daily as needed for cough.   butalbital-acetaminophen-caffeine (FIORICET, ESGIC) 50-325-40 MG per tablet Take  2 tablets by mouth 2 (two) times daily as needed for headache.   cetirizine (ZYRTEC) 10 MG tablet Take 10 mg by mouth daily.   Cholecalciferol (VITAMIN D) 50 MCG (2000 UT) tablet 1 tablet   Cholecalciferol 50 MCG (2000 UT) CAPS Take by mouth.   cyclobenzaprine (FLEXERIL) 10 MG tablet TAKE ONE TABLET THREE TIMES A DAY AS NEEDED FOR MUSCLE SPASMS   Cyclobenzaprine HCl (FLEXERIL PO) one tablet   EPINEPHrine 0.3 mg/0.3 mL IJ SOAJ injection Inject into the muscle.   Galcanezumab-gnlm (EMGALITY) 120 MG/ML SOSY Inject 240 mg/mL into  the skin every 30 (thirty) days.   omeprazole (PRILOSEC) 40 MG capsule Take 40 mg by mouth daily.   promethazine (PHENERGAN) 25 MG tablet Take 25 mg by mouth every 6 (six) hours as needed for nausea or vomiting.   sertraline (ZOLOFT) 100 MG tablet Take 100 mg by mouth daily.   simvastatin (ZOCOR) 40 MG tablet Take 40 mg by mouth daily. Take 1/2 daily   temazepam (RESTORIL) 30 MG capsule Take 30 mg by mouth at bedtime as needed for sleep.   topiramate (TOPAMAX) 200 MG tablet Take 200 mg by mouth 2 (two) times daily.   zolpidem (AMBIEN CR) 12.5 MG CR tablet Take 1 tablet (12.5 mg total) by mouth at bedtime as needed for sleep.   No facility-administered encounter medications on file as of 06/04/2023.    Allergies (verified) Amoxicillin, Bee venom, Penicillin g, and Penicillins   History: Past Medical History:  Diagnosis Date   Chronic back pain    High cholesterol    Mental disorder    Migraine    Takes topamax   PTSD (post-traumatic stress disorder)    Sleep apnea    Past Surgical History:  Procedure Laterality Date   HEMORRHOID SURGERY     LUMBAR LAMINECTOMY/DECOMPRESSION MICRODISCECTOMY Left 11/11/2012   Procedure: LUMBAR LAMINECTOMY/DECOMPRESSION MICRODISCECTOMY 1 LEVEL;  Surgeon: Temple Pacini, MD;  Location: [facility];  Service: Neurosurgery;  Laterality: Left;  left five sacral one   SPINAL FUSION     TONSILECTOMY, ADENOIDECTOMY, BILATERAL MYRINGOTOMY AND TUBES     tubes 16 times   TONSILLECTOMY     Family History  Problem Relation Age of Onset   Healthy Mother    Social History   Socioeconomic History   Marital status: Married    Spouse name: Not on file   Number of children: Not on file   Years of education: Not on file   Highest education level: Not on file  Occupational History   Not on file  Tobacco Use   Smoking status: Some Days    Current packs/day: 0.20    Average packs/day: 0.2 packs/day for 3.0 years (0.6 ttl pk-yrs)    Types: Cigarettes    Smokeless tobacco: Never   Tobacco comments:    8 cigs per day 07/06/22.   Vaping Use   Vaping status: Never Used  Substance and Sexual Activity   Alcohol use: Yes    Alcohol/week: 6.0 standard drinks of alcohol    Types: 6 Cans of beer per week   Drug use: No   Sexual activity: Not on file  Other Topics Concern   Not on file  Social History Narrative   Not on file   Social Determinants of Health   Financial Resource Strain: Low Risk  (06/04/2023)   Overall Financial Resource Strain (CARDIA)    Difficulty of Paying Living Expenses: Not hard at all  Food Insecurity: No Food  Insecurity (06/04/2023)   Hunger Vital Sign    Worried About Running Out of Food in the Last Year: Never true    Ran Out of Food in the Last Year: Never true  Transportation Needs: No Transportation Needs (06/04/2023)   PRAPARE - Administrator, Civil Service (Medical): No    Lack of Transportation (Non-Medical): No  Physical Activity: Inactive (06/04/2023)   Exercise Vital Sign    Days of Exercise per Week: 0 days    Minutes of Exercise per Session: 0 min  Stress: No Stress Concern Present (06/04/2023)   [employer] of Occupational Health - Occupational Stress Questionnaire    Feeling of Stress : Not at all  Social Connections: Moderately Isolated (06/04/2023)   Social Connection and Isolation Panel [NHANES]    Frequency of Communication with Friends and Family: More than three times a week    Frequency of Social Gatherings with Friends and Family: More than three times a week    Attends Religious Services: Never    Database administrator or Organizations: No    Attends Banker Meetings: Never    Marital Status: Married    Tobacco Counseling Ready to quit: Yes Counseling given: Yes Tobacco comments: 8 cigs per day 07/06/22.    Clinical Intake:  Pre-visit preparation completed: Yes  Pain : 0-10 Pain Score: 6  Pain Type: Chronic pain Pain Location: Back Pain  Orientation: Lower Pain Descriptors / Indicators: Burning, Constant Pain Onset: Other (comment) Pain Frequency: Constant     BMI - recorded: 33.29 Nutritional Status: BMI > 30  Obese Nutritional Risks: None Diabetes: No  How often do you need to have someone help you when you read instructions, pamphlets, or other written materials from your doctor or pharmacy?: 1 - Never  Interpreter Needed?: No  Information entered by ::  Verleen Stuckey, CMA   Activities of Daily Living    06/04/2023   11:34 AM  In your present state of health, do you have any difficulty performing the following activities:  Hearing? 1  Comment wears hearing aids  Vision? 0  Difficulty concentrating or making decisions? 0  Walking or climbing stairs? 1  Comment due to chronic low back pain. uses an Sport and exercise psychologist or bathing? 1  Comment anything that requires bending at the waste he needs help with  Doing errands, shopping? 0  Comment he can go alone but has to use his scooter  Preparing Food and eating ? Y  Comment needs assistance with this  Using the Toilet? Y  Comment needs help after bowel movements  In the past six months, have you accidently leaked urine? N  Managing your Medications? N  Managing your Finances? N  Housekeeping or managing your Housekeeping? Y  Comment needs help with this. his spouse and children assist with that    Patient Care Team: [facility] as PCP - General (General Practice)  Indicate any recent Medical Services you may have received from other than [facility] providers in the past year (date may be approximate).     Assessment:   This is a routine wellness examination for [place].  Hearing/Vision screen Hearing Screening - Comments:: Patient wears hearing aids and is utd with audiology at the [facility]  Vision Screening - Comments:: Wears rx glasses - up to date with routine eye exams with [facility]   Goals Addressed             This Visit's  Progress  Patient Stated       To remain active and healthy        Depression Screen    06/04/2023   11:39 AM 10/20/2022    8:38 AM 02/10/2021    9:34 AM 09/10/2020    9:22 AM  PHQ 2/9 Scores  PHQ - 2 Score 0 5 2 4   PHQ- 9 Score  21  11    Fall Risk    06/04/2023   11:39 AM 10/20/2022    8:37 AM 02/10/2021    9:31 AM 01/13/2021    9:22 AM 09/28/2020    9:24 AM  Fall Risk   Falls in the past year? 0 0 0 0 0  Number falls in past yr: 0 0 0    Injury with Fall? 0 0 0    Risk for fall due to : No Fall Risks No Fall Risks     Follow up Falls prevention discussed        MEDICARE RISK AT HOME: Medicare Risk at Home Any stairs in or around the home?: No If so, are there any without handrails?: No Home free of loose throw rugs in walkways, pet beds, electrical cords, etc?: Yes Adequate lighting in your home to reduce risk of falls?: Yes Life alert?: No Use of a cane, walker or w/c?: Yes Grab bars in the bathroom?: Yes Shower chair or bench in shower?: Yes Elevated toilet seat or a handicapped toilet?: Yes  TIMED UP AND GO:  Was the test performed? No    Cognitive Function:        06/04/2023   11:39 AM  6CIT Screen  What Year? 0 points  What month? 0 points  What time? 0 points  Count back from 20 0 points  Months in reverse 0 points  Repeat phrase 0 points  Total Score 0 points    Immunizations Immunization History  Administered Date(s) Administered   Influenza Inj Mdck Quad Pf 06/01/2016   Influenza Split 11/11/2012   Influenza, Seasonal, Injecte, Preservative Fre 05/29/2015   Influenza,inj,Quad PF,6+ Mos 08/18/2017, 08/17/2018, 05/31/2019, 05/18/2020, 06/01/2021, 06/01/2022   Influenza-Unspecified 07/06/2007, 08/04/2008, 10/14/2009, 08/12/2010, 07/06/2011, 11/11/2012, 10/16/2013, 06/24/2014, 06/05/2019   Moderna Covid-19 Vaccine Bivalent Booster 52yrs & up 06/01/2021   Moderna Sars-Covid-2 Vaccination 10/28/2019, 11/25/2019, 09/29/2020   PNEUMOCOCCAL  CONJUGATE-20 03/15/2021   Pneumococcal Polysaccharide-23 01/27/2014   Td (Adult),5 Lf Tetanus Toxid, Preservative Free 05/18/2020   Tdap 10/14/2010    TDAP status: Up to date  Flu Vaccine status: Due, Education has been provided regarding the importance of this vaccine. Advised may receive this vaccine at local pharmacy or Health Dept. Aware to provide a copy of the vaccination record if obtained from local pharmacy or Health Dept. Verbalized acceptance and understanding.  Pneumococcal vaccine status: Not age appropriate for this patient.   Covid-19 vaccine status: Information provided on how to obtain vaccines.   Qualifies for Shingles Vaccine? No   Shingrix: Not age appropriate for this patient.   Screening Tests Health Maintenance  Topic Date Due   Medicare Annual Wellness (AWV)  Never done   INFLUENZA VACCINE  04/05/2023   COVID-19 Vaccine (6 - 2023-24 season) 05/06/2023   DTaP/Tdap/Td (3 - Td or Tdap) 05/18/2030   Colonoscopy  02/23/2032   Hepatitis C Screening  Completed   HIV Screening  Completed   HPV VACCINES  Aged Out    Health Maintenance  Health Maintenance Due  Topic Date Due   Medicare Annual Wellness (AWV)  Never  done   INFLUENZA VACCINE  04/05/2023   COVID-19 Vaccine (6 - 2023-24 season) 05/06/2023    Colorectal cancer screening: Type of screening: Colonoscopy. Completed 02/22/2022. Repeat every 10 years  Lung Cancer Screening: (Low Dose CT Chest recommended if Age 72-80 years, 20 pack-year currently smoking OR have quit w/in 15years.) does not qualify.     Additional Screening:  Hepatitis C Screening: does not qualify; Completed 10/20/2022  Vision Screening: Recommended annual ophthalmology exams for early detection of glaucoma and other disorders of the eye. Is the patient up to date with their annual eye exam?  Yes  Who is the provider or what is the name of the office in which the patient attends annual eye exams? [facility] If pt is not  established with a provider, would they like to be referred to a provider to establish care? No .   Dental Screening: Recommended annual dental exams for proper oral hygiene  Diabetic Foot Exam: n/a  Community Resource Referral / Chronic Care Management: CRR required this visit?  No   CCM required this visit?  No    Plan:     I have personally reviewed and noted the following in the patient's chart:   Medical and social history Use of alcohol, tobacco or illicit drugs  Current medications and supplements including opioid prescriptions. Patient is not currently taking opioid prescriptions. Functional ability and status Nutritional status Physical activity Advanced directives List of other physicians Hospitalizations, surgeries, and ER visits in previous 12 months Vitals Screenings to include cognitive, depression, and falls Referrals and appointments  In addition, I have reviewed and discussed with patient certain preventive protocols, quality metrics, and best practice recommendations. A written personalized care plan for preventive services as well as general preventive health recommendations were provided to patient.     Jordan Hawks Zebulin Siegel, CMA   06/04/2023   After Visit Summary: (MyChart) Due to this being a telephonic visit, the after visit summary with patients personalized plan was offered to patient via MyChart

## 2023-06-04 NOTE — Patient Instructions (Signed)
Johnny Clarke , Thank you for taking time to come for your Medicare Wellness Visit. I appreciate your ongoing commitment to your health goals. Please review the following plan we discussed and let me know if I can assist you in the future.   Referrals/Orders/Follow-Ups/Clinician Recommendations:  Next Medicare Annual Wellness Visit: June 09, 2024 at 8:40am virtual visit  This is a list of the screening recommended for you and due dates:  Health Maintenance  Topic Date Due   Flu Shot  04/05/2023   COVID-19 Vaccine (6 - 2023-24 season) 05/06/2023   Medicare Annual Wellness Visit  06/03/2024   DTaP/Tdap/Td vaccine (3 - Td or Tdap) 05/18/2030   Colon Cancer Screening  02/23/2032   Hepatitis C Screening  Completed   HIV Screening  Completed   HPV Vaccine  Aged Out    Advanced directives: (Copy Requested) Please bring a copy of your health care power of attorney and living will to the office to be added to your chart at your convenience.  Next Medicare Annual Wellness Visit scheduled for next year: Yes

## 2024-06-09 ENCOUNTER — Encounter (INDEPENDENT_AMBULATORY_CARE_PROVIDER_SITE_OTHER): Payer: No Typology Code available for payment source

## 2024-07-01 ENCOUNTER — Ambulatory Visit (INDEPENDENT_AMBULATORY_CARE_PROVIDER_SITE_OTHER)

## 2024-07-01 ENCOUNTER — Encounter (INDEPENDENT_AMBULATORY_CARE_PROVIDER_SITE_OTHER): Payer: Self-pay

## 2024-07-01 VITALS — BP 122/72 | Ht 70.0 in | Wt 240.0 lb

## 2024-07-01 DIAGNOSIS — Z0000 Encounter for general adult medical examination without abnormal findings: Secondary | ICD-10-CM

## 2024-07-01 NOTE — Progress Notes (Signed)
 Because this visit was a virtual/telehealth visit,  certain criteria was not obtained, such a blood pressure, CBG if applicable, and timed get up and go. Any medications not marked as taking were not mentioned during the medication reconciliation part of the visit. Any vitals not documented were not able to be obtained due to this being a telehealth visit or patient was unable to self-report a recent blood pressure reading due to a lack of equipment at home via telehealth. Vitals that have been documented are verbally provided by the patient.   This visit was performed by a medical professional under my direct supervision. I was immediately available for consultation/collaboration. I have reviewed and agree with the Annual Wellness Visit documentation.  Subjective:   Johnny Clarke is a 48 y.o. who presents for a Medicare Wellness preventive visit.  As a reminder, Annual Wellness Visits don't include a physical exam, and some assessments may be limited, especially if this visit is performed virtually. We may recommend an in-person follow-up visit with your provider if needed.  Visit Complete: Virtual I connected with  Evalene Plowman on 07/01/24 by a audio enabled telemedicine application and verified that I am speaking with the correct person using two identifiers.  Patient Location: Home  Provider Location: Home Office  I discussed the limitations of evaluation and management by telemedicine. The patient expressed understanding and agreed to proceed.  Vital Signs: Because this visit was a virtual/telehealth visit, some criteria may be missing or patient reported. Any vitals not documented were not able to be obtained and vitals that have been documented are patient reported.  VideoDeclined- This patient declined Librarian, academic. Therefore the visit was completed with audio only.  Persons Participating in Visit: Patient.  AWV Questionnaire: No: Patient  Medicare AWV questionnaire was not completed prior to this visit.  Cardiac Risk Factors include: advanced age (>74men, >80 women);male gender;obesity (BMI >30kg/m2)     Objective:    Today's Vitals   07/01/24 0850 07/01/24 0851  BP: 122/72   Weight: 240 lb (108.9 kg)   Height: 5' 10 (1.778 m)   PainSc:  6    Body mass index is 34.44 kg/m.     07/01/2024    8:54 AM 06/04/2023   11:38 AM 12/01/2020    2:44 PM 11/23/2014   10:27 AM 04/05/2013    2:12 PM 11/11/2012    6:44 AM 03/21/2012    8:01 PM  Advanced Directives  Does Patient Have a Medical Advance Directive? Yes Yes Yes No   Patient does not have advance directive    Type of Advance Directive Healthcare Power of [place];Living will Healthcare Power of [place];Living will Healthcare Power of [place];Living will      Does patient want to make changes to medical advance directive? No - Patient declined  No - Patient declined      Copy of Healthcare Power of Attorney in Chart? No - copy requested No - copy requested       Would patient like information on creating a medical advance directive?    No - patient declined information      Pre-existing out of facility DNR order (yellow form or pink MOST form)            Information is confidential and restricted. Go to Review Flowsheets to unlock data.   Data saved with a previous flowsheet row definition    Current Medications (verified) Outpatient Encounter Medications as of 07/01/2024  Medication Sig   acetic  acid 2 % otic solution INSTILL 3-5 DROPS IN BOTH EARS MONDAY, WEDNESDAY AND FRIDAY AS DIRECTED.  FOLLOW INSTILLATION OF MEDICINE WITH 15 MINUTES OF EAR CANAL OCCLUSION  WITH SILICONE EAR PLUG TO ALLOW ADEQUATE TIME FOR MEDICATION EFFECT. AS DIRECTED.  FOLLOW INSTILLATION OF MEDICINE WITH 15 MINUTES OF EAR CANAL OCCLUSION  WITH SILICONE EAR PLUG TO ALLOW ADEQUATE TIME FOR MEDICATION EFFECT.   acyclovir (ZOVIRAX) 200 MG capsule TAKE FOUR CAPSULES BY MOUTH TWICE A DAY    albuterol (VENTOLIN) (5 MG/ML) 0.5% NEBU Inhale 2 puffs into the lungs 2 (two) times daily.   atorvastatin (LIPITOR) 20 MG tablet 1 tablet   B Complex-C (B COMPLEX-VITAMIN C) CAPS TAKE 1 CAP/TAB BY MOUTH DAILY   benzonatate (TESSALON) 100 MG capsule Take by mouth 3 (three) times daily as needed for cough.   butalbital -acetaminophen -caffeine  (FIORICET , ESGIC ) 50-325-40 MG per tablet Take 2 tablets by mouth 2 (two) times daily as needed for headache.   cetirizine (ZYRTEC) 10 MG tablet Take 10 mg by mouth daily.   Cholecalciferol (VITAMIN D ) 50 MCG (2000 UT) tablet 1 tablet   Cholecalciferol 50 MCG (2000 UT) CAPS Take by mouth.   cyclobenzaprine  (FLEXERIL ) 10 MG tablet TAKE ONE TABLET THREE TIMES A DAY AS NEEDED FOR MUSCLE SPASMS   Cyclobenzaprine  HCl (FLEXERIL  PO) one tablet   EPINEPHrine 0.3 mg/0.3 mL IJ SOAJ injection Inject into the muscle.   Galcanezumab-gnlm (EMGALITY) 120 MG/ML SOSY Inject 240 mg/mL into the skin every 30 (thirty) days.   omeprazole (PRILOSEC) 40 MG capsule Take 40 mg by mouth daily.   promethazine  (PHENERGAN ) 25 MG tablet Take 25 mg by mouth every 6 (six) hours as needed for nausea or vomiting.   sertraline (ZOLOFT) 100 MG tablet Take 100 mg by mouth daily.   simvastatin  (ZOCOR ) 40 MG tablet Take 40 mg by mouth daily. Take 1/2 daily   temazepam (RESTORIL) 30 MG capsule Take 30 mg by mouth at bedtime as needed for sleep.   topiramate  (TOPAMAX ) 200 MG tablet Take 200 mg by mouth 2 (two) times daily.   zolpidem  (AMBIEN  CR) 12.5 MG CR tablet Take 1 tablet (12.5 mg total) by mouth at bedtime as needed for sleep.   No facility-administered encounter medications on file as of 07/01/2024.    Allergies (verified) Amoxicillin, Bee venom, Penicillin g, and Penicillins   History: Past Medical History:  Diagnosis Date   Chronic back pain    High cholesterol    Mental disorder    Migraine    Takes topamax    PTSD (post-traumatic stress disorder)    Sleep apnea    Past  Surgical History:  Procedure Laterality Date   HEMORRHOID SURGERY     LUMBAR LAMINECTOMY/DECOMPRESSION MICRODISCECTOMY Left 11/11/2012   Procedure: LUMBAR LAMINECTOMY/DECOMPRESSION MICRODISCECTOMY 1 LEVEL;  Surgeon: Victory DELENA Gunnels, MD;  Location: [facility];  Service: Neurosurgery;  Laterality: Left;  left five sacral one   SPINAL FUSION     TONSILECTOMY, ADENOIDECTOMY, BILATERAL MYRINGOTOMY AND TUBES     tubes 16 times   TONSILLECTOMY     Family History  Problem Relation Age of Onset   Healthy Mother    Social History   Socioeconomic History   Marital status: Married    Spouse name: Not on file   Number of children: Not on file   Years of education: Not on file   Highest education level: Not on file  Occupational History   Not on file  Tobacco Use   Smoking status:  Some Days    Current packs/day: 0.20    Average packs/day: 0.2 packs/day for 3.0 years (0.6 ttl pk-yrs)    Types: Cigarettes   Smokeless tobacco: Never   Tobacco comments:    8 cigs per day 07/06/22.   Vaping Use   Vaping status: Never Used  Substance and Sexual Activity   Alcohol use: Yes    Alcohol/week: 6.0 standard drinks of alcohol    Types: 6 Cans of beer per week   Drug use: No   Sexual activity: Not on file  Other Topics Concern   Not on file  Social History Narrative   Not on file   Social Drivers of Health   Financial Resource Strain: Low Risk  (07/01/2024)   Overall Financial Resource Strain (CARDIA)    Difficulty of Paying Living Expenses: Not hard at all  Food Insecurity: No Food Insecurity (07/01/2024)   Hunger Vital Sign    Worried About Running Out of Food in the Last Year: Never true    Ran Out of Food in the Last Year: Never true  Transportation Needs: No Transportation Needs (07/01/2024)   PRAPARE - Administrator, Civil Service (Medical): No    Lack of Transportation (Non-Medical): No  Physical Activity: Inactive (07/01/2024)   Exercise Vital Sign    Days of Exercise  per Week: 0 days    Minutes of Exercise per Session: 0 min  Stress: No Stress Concern Present (07/01/2024)   [employer] of Occupational Health - Occupational Stress Questionnaire    Feeling of Stress: Not at all  Social Connections: Moderately Isolated (07/01/2024)   Social Connection and Isolation Panel    Frequency of Communication with Friends and Family: More than three times a week    Frequency of Social Gatherings with Friends and Family: More than three times a week    Attends Religious Services: Never    Database Administrator or Organizations: No    Attends Banker Meetings: Never    Marital Status: Married    Tobacco Counseling Ready to quit: Not Answered Counseling given: Not Answered Tobacco comments: 8 cigs per day 07/06/22.     Clinical Intake:  Pre-visit preparation completed: Yes  Pain : 0-10 Pain Score: 6  Pain Type: Acute pain Pain Location: Back Pain Orientation: Mid, Lower Pain Descriptors / Indicators: Aching Pain Onset: Today Pain Frequency: Several days a week     BMI - recorded: 34.44 Nutritional Risks: None Diabetes: No  No results found for: HGBA1C   How often do you need to have someone help you when you read instructions, pamphlets, or other written materials from your doctor or pharmacy?: 1 - Never  Interpreter Needed?: No  Information entered by :: Dallie Patton,CMA   Activities of Daily Living     07/01/2024    8:53 AM  In your present state of health, do you have any difficulty performing the following activities:  Hearing? 0  Vision? 0  Difficulty concentrating or making decisions? 0  Walking or climbing stairs? 1  Dressing or bathing? 1  Doing errands, shopping? 0  Preparing Food and eating ? Y  Using the Toilet? N  In the past six months, have you accidently leaked urine? N  Do you have problems with loss of bowel control? N  Managing your Medications? Y  Managing your Finances? Y   Housekeeping or managing your Housekeeping? Y    Patient Care Team: [facility] as [facility]CP -  General (General Practice)  I have updated your Care Teams any recent Medical Services you may have received from other providers in the past year.     Assessment:   This is a routine wellness examination for Jourden.  Hearing/Vision screen Hearing Screening - Comments:: Wears hearing aids Vision Screening - Comments:: Wears glasses and contacts   Goals Addressed             This Visit's Progress    Patient Stated   On track    To remain active and healthy        Depression Screen     07/01/2024    8:55 AM 06/04/2023   11:39 AM 10/20/2022    8:38 AM 02/10/2021    9:34 AM 09/10/2020    9:22 AM  PHQ 2/9 Scores  PHQ - 2 Score 0 0 5 2 4   PHQ- 9 Score 1  21  11     Fall Risk     07/01/2024    8:54 AM 06/04/2023   11:39 AM 10/20/2022    8:37 AM 02/10/2021    9:31 AM 01/13/2021    9:22 AM  Fall Risk   Falls in the past year? 1 0 0 0 0  Number falls in past yr: 0 0 0 0   Injury with Fall? 0 0 0 0   Risk for fall due to : History of fall(s);Impaired mobility;Impaired balance/gait No Fall Risks No Fall Risks    Follow up Falls evaluation completed Falls prevention discussed       MEDICARE RISK AT HOME:  Medicare Risk at Home Any stairs in or around the home?: Yes If so, are there any without handrails?: No Home free of loose throw rugs in walkways, pet beds, electrical cords, etc?: Yes Adequate lighting in your home to reduce risk of falls?: Yes Life alert?: No Use of a cane, walker or w/c?: No Grab bars in the bathroom?: Yes Shower chair or bench in shower?: Yes Elevated toilet seat or a handicapped toilet?: Yes  TIMED UP AND GO:  Was the test performed?  No  Cognitive Function: 6CIT completed        07/01/2024    8:52 AM 06/04/2023   11:39 AM  6CIT Screen  What Year? 0 points 0 points  What month? 0 points 0 points  What time? 0 points 0 points  Count back  from 20 0 points 0 points  Months in reverse 0 points 0 points  Repeat phrase 0 points 0 points  Total Score 0 points 0 points    Immunizations Immunization History  Administered Date(s) Administered   Influenza Inj Mdck Quad Pf 06/01/2016   Influenza Split 11/11/2012   Influenza, Seasonal, Injecte, Preservative Fre 05/29/2015   Influenza,inj,Quad PF,6+ Mos 08/18/2017, 08/17/2018, 05/31/2019, 05/18/2020, 06/01/2021, 06/01/2022   Influenza-Unspecified 07/06/2007, 08/04/2008, 10/14/2009, 08/12/2010, 07/06/2011, 11/11/2012, 10/16/2013, 06/24/2014, 06/05/2019   Moderna Covid-19 Vaccine Bivalent Booster 81yrs & up 06/01/2021   Moderna Sars-Covid-2 Vaccination 10/28/2019, 11/25/2019, 09/29/2020   PNEUMOCOCCAL CONJUGATE-20 03/15/2021   Pneumococcal Polysaccharide-23 01/27/2014   Td (Adult),5 Lf Tetanus Toxid, Preservative Free 05/18/2020   Tdap 10/14/2010    Screening Tests Health Maintenance  Topic Date Due   Hepatitis B Vaccines 19-59 Average Risk (1 of 3 - 19+ 3-dose series) Never done   Influenza Vaccine  04/04/2024   COVID-19 Vaccine (7 - 2025-26 season) 05/05/2024   Medicare Annual Wellness (AWV)  07/01/2025   DTaP/Tdap/Td (3 - Td or Tdap) 05/18/2030   Colonoscopy  02/02/2032   Pneumococcal Vaccine  Completed   Hepatitis C Screening  Completed   HIV Screening  Completed   HPV VACCINES  Aged Out   Meningococcal B Vaccine  Aged Out    Health Maintenance Items Addressed:patient declined   Additional Screening:  Vision Screening: Recommended annual ophthalmology exams for early detection of glaucoma and other disorders of the eye. Is the patient up to date with their annual eye exam?  Yes   Dental Screening: Recommended annual dental exams for proper oral hygiene  Community Resource Referral / Chronic Care Management: CRR required this visit?  No   CCM required this visit?  No   Plan:    I have personally reviewed and noted the following in the patient's chart:    Medical and social history Use of alcohol, tobacco or illicit drugs  Current medications and supplements including opioid prescriptions. Patient is not currently taking opioid prescriptions. Functional ability and status Nutritional status Physical activity Advanced directives List of other physicians Hospitalizations, surgeries, and ER visits in previous 12 months Vitals Screenings to include cognitive, depression, and falls Referrals and appointments  In addition, I have reviewed and discussed with patient certain preventive protocols, quality metrics, and best practice recommendations. A written personalized care plan for preventive services as well as general preventive health recommendations were provided to patient.   Lyle MARLA Right, [place]   07/01/2024   After Visit Summary: (MyChart) Due to this being a telephonic visit, the after visit summary with patients personalized plan was offered to patient via MyChart   Notes: Nothing significant to report at this time.

## 2024-07-01 NOTE — Patient Instructions (Signed)
 Mr. Johnny Clarke,  Thank you for taking the time for your Medicare Wellness Visit. I appreciate your continued commitment to your health goals. Please review the care plan we discussed, and feel free to reach out if I can assist you further.  Medicare recommends these wellness visits once per year to help you and your care team stay ahead of potential health issues. These visits are designed to focus on prevention, allowing your provider to concentrate on managing your acute and chronic conditions during your regular appointments.  Please note that Annual Wellness Visits do not include a physical exam. Some assessments may be limited, especially if the visit was conducted virtually. If needed, we may recommend a separate in-person follow-up with your provider.  Ongoing Care Seeing your primary care provider every 3 to 6 months helps us  monitor your health and provide consistent, personalized care.   Referrals If a referral was made during today's visit and you haven't received any updates within two weeks, please contact the referred provider directly to check on the status.  Recommended Screenings:  Health Maintenance  Topic Date Due   Hepatitis B Vaccine (1 of 3 - 19+ 3-dose series) Never done   Flu Shot  04/04/2024   COVID-19 Vaccine (6 - 2025-26 season) 05/05/2024   Medicare Annual Wellness Visit  06/03/2024   DTaP/Tdap/Td vaccine (3 - Td or Tdap) 05/18/2030   Colon Cancer Screening  02/23/2032   Pneumococcal Vaccine  Completed   Hepatitis C Screening  Completed   HIV Screening  Completed   HPV Vaccine  Aged Out   Meningitis B Vaccine  Aged Out       07/01/2024    8:54 AM  Advanced Directives  Does Patient Have a Medical Advance Directive? Yes  Type of Estate Agent of [place];Living will  Does patient want to make changes to medical advance directive? No - Patient declined  Copy of Healthcare Power of Attorney in Chart? No - copy requested   Advance Care  Planning is important because it: Ensures you receive medical care that aligns with your values, goals, and preferences. Provides guidance to your family and loved ones, reducing the emotional burden of decision-making during critical moments.  Vision: Annual vision screenings are recommended for early detection of glaucoma, cataracts, and diabetic retinopathy. These exams can also reveal signs of chronic conditions such as diabetes and high blood pressure.  Dental: Annual dental screenings help detect early signs of oral cancer, gum disease, and other conditions linked to overall health, including heart disease and diabetes.  Please see the attached documents for additional preventive care recommendations.
